# Patient Record
Sex: Female | Born: 1965 | Race: White | Hispanic: No | Smoking: Current every day smoker
Health system: Southern US, Community
[De-identification: ages and names within clinical notes are randomized; demographics above are authoritative.]

## PROBLEM LIST (undated history)

## (undated) ENCOUNTER — Emergency Department (HOSPITAL_COMMUNITY): Payer: Medicare Other

## (undated) DIAGNOSIS — F445 Conversion disorder with seizures or convulsions: Secondary | ICD-10-CM

## (undated) DIAGNOSIS — F1920 Other psychoactive substance dependence, uncomplicated: Secondary | ICD-10-CM

## (undated) DIAGNOSIS — N200 Calculus of kidney: Secondary | ICD-10-CM

## (undated) DIAGNOSIS — M549 Dorsalgia, unspecified: Secondary | ICD-10-CM

## (undated) DIAGNOSIS — F32A Depression, unspecified: Secondary | ICD-10-CM

## (undated) DIAGNOSIS — F419 Anxiety disorder, unspecified: Secondary | ICD-10-CM

## (undated) DIAGNOSIS — F329 Major depressive disorder, single episode, unspecified: Secondary | ICD-10-CM

## (undated) DIAGNOSIS — G8929 Other chronic pain: Secondary | ICD-10-CM

## (undated) DIAGNOSIS — Z765 Malingerer [conscious simulation]: Secondary | ICD-10-CM

## (undated) DIAGNOSIS — J449 Chronic obstructive pulmonary disease, unspecified: Secondary | ICD-10-CM

## (undated) DIAGNOSIS — J45909 Unspecified asthma, uncomplicated: Secondary | ICD-10-CM

## (undated) HISTORY — DX: Major depressive disorder, single episode, unspecified: F32.9

## (undated) HISTORY — DX: Other psychoactive substance dependence, uncomplicated: F19.20

## (undated) HISTORY — PX: BACK SURGERY: SHX140

## (undated) HISTORY — DX: Chronic obstructive pulmonary disease, unspecified: J44.9

## (undated) HISTORY — DX: Conversion disorder with seizures or convulsions: F44.5

## (undated) HISTORY — DX: Malingerer (conscious simulation): Z76.5

## (undated) HISTORY — DX: Anxiety disorder, unspecified: F41.9

## (undated) HISTORY — DX: Depression, unspecified: F32.A

## (undated) HISTORY — PX: ABDOMINAL HYSTERECTOMY: SHX81

## (undated) HISTORY — PX: INSERTION OF MESH: SHX5868

---

## 1997-08-21 ENCOUNTER — Emergency Department (HOSPITAL_COMMUNITY): Admission: EM | Admit: 1997-08-21 | Discharge: 1997-08-21 | Payer: Self-pay | Admitting: Emergency Medicine

## 1997-09-30 ENCOUNTER — Emergency Department (HOSPITAL_COMMUNITY): Admission: EM | Admit: 1997-09-30 | Discharge: 1997-09-30 | Payer: Self-pay | Admitting: Emergency Medicine

## 1997-10-03 ENCOUNTER — Emergency Department (HOSPITAL_COMMUNITY): Admission: EM | Admit: 1997-10-03 | Discharge: 1997-10-03 | Payer: Self-pay | Admitting: Emergency Medicine

## 1997-10-05 ENCOUNTER — Emergency Department (HOSPITAL_COMMUNITY): Admission: EM | Admit: 1997-10-05 | Discharge: 1997-10-05 | Payer: Self-pay | Admitting: Emergency Medicine

## 2003-04-09 ENCOUNTER — Emergency Department (HOSPITAL_COMMUNITY): Admission: AD | Admit: 2003-04-09 | Discharge: 2003-04-09 | Payer: Self-pay | Admitting: Family Medicine

## 2003-09-28 ENCOUNTER — Emergency Department (HOSPITAL_COMMUNITY): Admission: EM | Admit: 2003-09-28 | Discharge: 2003-09-28 | Payer: Self-pay | Admitting: Emergency Medicine

## 2003-09-28 ENCOUNTER — Emergency Department (HOSPITAL_COMMUNITY): Admission: EM | Admit: 2003-09-28 | Discharge: 2003-09-28 | Payer: Self-pay | Admitting: Family Medicine

## 2003-09-29 ENCOUNTER — Emergency Department (HOSPITAL_COMMUNITY): Admission: EM | Admit: 2003-09-29 | Discharge: 2003-09-29 | Payer: Self-pay | Admitting: Emergency Medicine

## 2008-12-09 ENCOUNTER — Ambulatory Visit: Payer: Self-pay | Admitting: Family Medicine

## 2008-12-09 DIAGNOSIS — F411 Generalized anxiety disorder: Secondary | ICD-10-CM | POA: Insufficient documentation

## 2008-12-09 DIAGNOSIS — J45909 Unspecified asthma, uncomplicated: Secondary | ICD-10-CM | POA: Insufficient documentation

## 2008-12-16 ENCOUNTER — Telehealth (INDEPENDENT_AMBULATORY_CARE_PROVIDER_SITE_OTHER): Payer: Self-pay | Admitting: *Deleted

## 2008-12-23 ENCOUNTER — Ambulatory Visit: Payer: Self-pay | Admitting: Family Medicine

## 2008-12-24 ENCOUNTER — Encounter: Payer: Self-pay | Admitting: Family Medicine

## 2008-12-25 LAB — CONVERTED CEMR LAB
ALT: 22 units/L (ref 0–35)
AST: 16 units/L (ref 0–37)
Albumin: 3.9 g/dL (ref 3.5–5.2)
Alkaline Phosphatase: 75 units/L (ref 39–117)
BUN: 12 mg/dL (ref 6–23)
Basophils Absolute: 0 10*3/uL (ref 0.0–0.1)
Basophils Relative: 0 % (ref 0–1)
CO2: 25 meq/L (ref 19–32)
Calcium: 8.9 mg/dL (ref 8.4–10.5)
Chloride: 101 meq/L (ref 96–112)
Creatinine, Ser: 0.72 mg/dL (ref 0.40–1.20)
Eosinophils Absolute: 0.4 10*3/uL (ref 0.0–0.7)
Eosinophils Relative: 3 % (ref 0–5)
Glucose, Bld: 97 mg/dL (ref 70–99)
HCT: 43.7 % (ref 36.0–46.0)
Hemoglobin: 14 g/dL (ref 12.0–15.0)
Lymphocytes Relative: 27 % (ref 12–46)
Lymphs Abs: 2.8 10*3/uL (ref 0.7–4.0)
MCHC: 32 g/dL (ref 30.0–36.0)
MCV: 94.4 fL (ref 78.0–100.0)
Monocytes Absolute: 0.8 10*3/uL (ref 0.1–1.0)
Monocytes Relative: 8 % (ref 3–12)
Neutro Abs: 6.5 10*3/uL (ref 1.7–7.7)
Neutrophils Relative %: 62 % (ref 43–77)
Platelets: 225 10*3/uL (ref 150–400)
Potassium: 4.1 meq/L (ref 3.5–5.3)
RBC: 4.63 M/uL (ref 3.87–5.11)
RDW: 13.1 % (ref 11.5–15.5)
Sodium: 136 meq/L (ref 135–145)
Total Bilirubin: 0.9 mg/dL (ref 0.3–1.2)
Total Protein: 6.6 g/dL (ref 6.0–8.3)
WBC: 10.5 10*3/uL (ref 4.0–10.5)

## 2008-12-28 ENCOUNTER — Telehealth: Payer: Self-pay | Admitting: Family Medicine

## 2009-01-05 ENCOUNTER — Ambulatory Visit: Payer: Self-pay | Admitting: Family Medicine

## 2009-01-05 ENCOUNTER — Encounter: Admission: RE | Admit: 2009-01-05 | Discharge: 2009-01-05 | Payer: Self-pay | Admitting: Family Medicine

## 2009-01-08 ENCOUNTER — Telehealth: Payer: Self-pay | Admitting: Family Medicine

## 2009-01-09 ENCOUNTER — Telehealth: Payer: Self-pay | Admitting: Family Medicine

## 2009-01-12 ENCOUNTER — Telehealth: Payer: Self-pay | Admitting: Family Medicine

## 2009-01-20 ENCOUNTER — Ambulatory Visit: Payer: Self-pay | Admitting: Family Medicine

## 2009-01-20 DIAGNOSIS — R252 Cramp and spasm: Secondary | ICD-10-CM | POA: Insufficient documentation

## 2009-01-20 LAB — CONVERTED CEMR LAB
Bacteria, UA: NONE SEEN
Blood in Urine, dipstick: NEGATIVE
Glucose, Urine, Semiquant: NEGATIVE
Ketones, urine, test strip: NEGATIVE
Nitrite: NEGATIVE
Protein, U semiquant: 30
RBC / HPF: NONE SEEN (ref <3)
Specific Gravity, Urine: 1.025
Urobilinogen, UA: 0.2
WBC Urine, dipstick: NEGATIVE
WBC, UA: NONE SEEN cells/hpf (ref <3)
pH: 5.5

## 2009-01-21 ENCOUNTER — Encounter: Payer: Self-pay | Admitting: Family Medicine

## 2009-01-30 ENCOUNTER — Telehealth: Payer: Self-pay | Admitting: Family Medicine

## 2009-02-01 ENCOUNTER — Telehealth: Payer: Self-pay | Admitting: Family Medicine

## 2009-02-15 ENCOUNTER — Ambulatory Visit: Payer: Self-pay | Admitting: Family Medicine

## 2009-02-15 DIAGNOSIS — R519 Headache, unspecified: Secondary | ICD-10-CM | POA: Insufficient documentation

## 2009-02-15 DIAGNOSIS — R51 Headache: Secondary | ICD-10-CM | POA: Insufficient documentation

## 2009-02-16 LAB — CONVERTED CEMR LAB
ALT: 23 units/L (ref 0–35)
AST: 20 units/L (ref 0–37)
Albumin: 4.6 g/dL (ref 3.5–5.2)
Alkaline Phosphatase: 64 units/L (ref 39–117)
BUN: 16 mg/dL (ref 6–23)
Basophils Absolute: 0.1 10*3/uL (ref 0.0–0.1)
Basophils Relative: 1 % (ref 0–1)
CO2: 25 meq/L (ref 19–32)
Calcium: 9.4 mg/dL (ref 8.4–10.5)
Chloride: 99 meq/L (ref 96–112)
Creatinine, Ser: 0.85 mg/dL (ref 0.40–1.20)
Eosinophils Absolute: 0.1 10*3/uL (ref 0.0–0.7)
Eosinophils Relative: 1 % (ref 0–5)
Glucose, Bld: 100 mg/dL — ABNORMAL HIGH (ref 70–99)
HCT: 47.7 % — ABNORMAL HIGH (ref 36.0–46.0)
Hemoglobin: 16 g/dL — ABNORMAL HIGH (ref 12.0–15.0)
Lymphocytes Relative: 37 % (ref 12–46)
Lymphs Abs: 3.3 10*3/uL (ref 0.7–4.0)
MCHC: 33.5 g/dL (ref 30.0–36.0)
MCV: 91.6 fL (ref 78.0–100.0)
Monocytes Absolute: 0.7 10*3/uL (ref 0.1–1.0)
Monocytes Relative: 7 % (ref 3–12)
Neutro Abs: 4.9 10*3/uL (ref 1.7–7.7)
Neutrophils Relative %: 54 % (ref 43–77)
Platelets: 250 10*3/uL (ref 150–400)
Potassium: 4.2 meq/L (ref 3.5–5.3)
RBC: 5.21 M/uL — ABNORMAL HIGH (ref 3.87–5.11)
RDW: 13 % (ref 11.5–15.5)
Sodium: 136 meq/L (ref 135–145)
TSH: 2.794 microintl units/mL (ref 0.350–4.500)
Total Bilirubin: 1.9 mg/dL — ABNORMAL HIGH (ref 0.3–1.2)
Total Protein: 7.3 g/dL (ref 6.0–8.3)
WBC: 9 10*3/uL (ref 4.0–10.5)

## 2009-02-17 ENCOUNTER — Telehealth: Payer: Self-pay | Admitting: Family Medicine

## 2009-03-13 ENCOUNTER — Ambulatory Visit: Payer: Self-pay | Admitting: Family Medicine

## 2009-03-13 DIAGNOSIS — IMO0001 Reserved for inherently not codable concepts without codable children: Secondary | ICD-10-CM | POA: Insufficient documentation

## 2009-03-13 DIAGNOSIS — R42 Dizziness and giddiness: Secondary | ICD-10-CM | POA: Insufficient documentation

## 2009-03-13 DIAGNOSIS — R110 Nausea: Secondary | ICD-10-CM | POA: Insufficient documentation

## 2009-03-14 ENCOUNTER — Encounter: Payer: Self-pay | Admitting: Family Medicine

## 2009-03-14 LAB — CONVERTED CEMR LAB
CRP: 0.2 mg/dL (ref <0.6)
Iron: 88 ug/dL (ref 42–145)
Saturation Ratios: 24 % (ref 20–55)
Sed Rate: 8 mm/hr (ref 0–22)
TIBC: 362 ug/dL (ref 250–470)
Total CK: 198 units/L — ABNORMAL HIGH (ref 7–177)
UIBC: 274 ug/dL

## 2009-03-21 ENCOUNTER — Telehealth: Payer: Self-pay | Admitting: Family Medicine

## 2009-04-04 ENCOUNTER — Telehealth: Payer: Self-pay | Admitting: Family Medicine

## 2009-04-06 ENCOUNTER — Telehealth: Payer: Self-pay | Admitting: Family Medicine

## 2009-04-06 ENCOUNTER — Telehealth (INDEPENDENT_AMBULATORY_CARE_PROVIDER_SITE_OTHER): Payer: Self-pay | Admitting: *Deleted

## 2009-04-11 ENCOUNTER — Ambulatory Visit: Payer: Self-pay | Admitting: Family Medicine

## 2009-05-05 ENCOUNTER — Telehealth: Payer: Self-pay | Admitting: Family Medicine

## 2009-05-09 ENCOUNTER — Telehealth: Payer: Self-pay | Admitting: Family Medicine

## 2009-05-11 ENCOUNTER — Ambulatory Visit: Payer: Self-pay | Admitting: Family Medicine

## 2009-05-11 LAB — CONVERTED CEMR LAB: Glucose, Bld: 84 mg/dL

## 2009-05-12 ENCOUNTER — Telehealth: Payer: Self-pay | Admitting: Family Medicine

## 2009-05-12 LAB — CONVERTED CEMR LAB
Magnesium: 1.9 mg/dL (ref 1.5–2.5)
Total CK: 64 units/L (ref 7–177)

## 2009-05-15 ENCOUNTER — Telehealth: Payer: Self-pay | Admitting: Family Medicine

## 2009-05-19 ENCOUNTER — Encounter (INDEPENDENT_AMBULATORY_CARE_PROVIDER_SITE_OTHER): Payer: Self-pay | Admitting: *Deleted

## 2009-05-22 ENCOUNTER — Telehealth (INDEPENDENT_AMBULATORY_CARE_PROVIDER_SITE_OTHER): Payer: Self-pay | Admitting: *Deleted

## 2009-05-22 ENCOUNTER — Telehealth: Payer: Self-pay | Admitting: Family Medicine

## 2009-05-23 ENCOUNTER — Telehealth: Payer: Self-pay | Admitting: Family Medicine

## 2009-05-23 ENCOUNTER — Telehealth (INDEPENDENT_AMBULATORY_CARE_PROVIDER_SITE_OTHER): Payer: Self-pay | Admitting: *Deleted

## 2009-05-25 ENCOUNTER — Telehealth: Payer: Self-pay | Admitting: Family Medicine

## 2009-05-29 ENCOUNTER — Telehealth (INDEPENDENT_AMBULATORY_CARE_PROVIDER_SITE_OTHER): Payer: Self-pay | Admitting: *Deleted

## 2009-06-06 ENCOUNTER — Ambulatory Visit: Payer: Self-pay | Admitting: Family Medicine

## 2009-06-13 ENCOUNTER — Encounter: Payer: Self-pay | Admitting: Family Medicine

## 2009-06-13 ENCOUNTER — Telehealth (INDEPENDENT_AMBULATORY_CARE_PROVIDER_SITE_OTHER): Payer: Self-pay | Admitting: *Deleted

## 2009-06-19 ENCOUNTER — Encounter (INDEPENDENT_AMBULATORY_CARE_PROVIDER_SITE_OTHER): Payer: Self-pay | Admitting: *Deleted

## 2009-06-19 ENCOUNTER — Encounter: Payer: Self-pay | Admitting: Family Medicine

## 2009-06-20 ENCOUNTER — Telehealth (INDEPENDENT_AMBULATORY_CARE_PROVIDER_SITE_OTHER): Payer: Self-pay | Admitting: *Deleted

## 2009-07-03 ENCOUNTER — Ambulatory Visit: Payer: Self-pay | Admitting: Family Medicine

## 2009-07-18 ENCOUNTER — Ambulatory Visit: Payer: Self-pay | Admitting: Family Medicine

## 2009-07-19 ENCOUNTER — Telehealth (INDEPENDENT_AMBULATORY_CARE_PROVIDER_SITE_OTHER): Payer: Self-pay | Admitting: *Deleted

## 2009-07-19 DIAGNOSIS — M79609 Pain in unspecified limb: Secondary | ICD-10-CM | POA: Insufficient documentation

## 2009-07-29 ENCOUNTER — Encounter: Payer: Self-pay | Admitting: *Deleted

## 2009-08-02 ENCOUNTER — Encounter: Payer: Self-pay | Admitting: Family Medicine

## 2009-08-18 ENCOUNTER — Encounter: Payer: Self-pay | Admitting: Family Medicine

## 2010-01-11 DIAGNOSIS — G2581 Restless legs syndrome: Secondary | ICD-10-CM | POA: Insufficient documentation

## 2010-04-08 HISTORY — PX: CHOLECYSTECTOMY: SHX55

## 2010-04-29 ENCOUNTER — Encounter: Payer: Self-pay | Admitting: Family Medicine

## 2010-05-08 NOTE — Progress Notes (Signed)
Summary: ? on meds  Phone Note Call from Patient Call back at Home Phone [phone]   Caller: Patient Call For: Nani Gasser MD Summary of Call: Pt called and wanted to know if she needed to use the albuterol with the duoneb or hold the albuterol when uses the duoneb- pt is confused. Also said something about ? referral to pulmonology Initial call taken by: Kathlene November,  January 12, 2009 2:38 PM  Follow-up for Phone Call        We can refer to pulm if she wouldlike.  albuterolis in the duoneb so doesn't need to do both.  Follow-up by: Nani Gasser MD,  January 12, 2009 2:53 PM  Additional Follow-up for Phone Call Additional follow up Details #1::        pt notified. KJ LPN Additional Follow-up by: Kathlene November,  January 12, 2009 3:26 PM

## 2010-05-08 NOTE — Progress Notes (Signed)
Summary: needs appt  Phone Note Call from Patient Call back at Home Phone [phone]   Caller: Patient Call For: Nani Gasser MD Summary of Call: pt wants to know if she needs appt cause she is depressed and her body is cramping all over.  She also has swelling in her ankles.  I called pt to schedule appt but it just rings and there is not an answering machine Initial call taken by: Alfred Levins, CMA,  April 06, 2009 3:21 PM  Follow-up for Phone Call        CALLED PT TO [facility] AN APPT Follow-up by: Vanessa Swaziland,  April 10, 2009 8:23 AM

## 2010-05-08 NOTE — Assessment & Plan Note (Signed)
Summary: leg pain & f/u on meds   Vital Signs:  Patient profile:   45 year old female Height:      65 inches Weight:      193 pounds Pulse rate:   80 / minute BP sitting:   128 / 70  (left arm) Cuff size:   large  Vitals Entered By: Kathlene November (July 18, 2009 10:07 AM) CC: pain in legs for months- no better also pain in left side of head that comes and goes for a long time now   Primary Care Provider:  Nani Gasser MD  CC:  pain in legs for months- no better also pain in left side of head that comes and goes for a long time now.  History of Present Illness: pain in legs for months- no better also pain in left side of head that comes and goes for a long time now.   Tried the requip for about for over a month and didn't really help.   Never tried taking 2 tabs as I had instructed.  Has also felt more off balance lately. Still has not rescheduled her rheumatology appt.  Wonders if she would benefit from seeing neurology for her leg pain.    Current Medications (verified): 1)  Xanax 1 Mg Tabs (Alprazolam) .Marland Kitchen.. 1 Tab By Mouth Two Times A Day To Three Times A Day As Needed 2)  Proair Hfa 108 (90 Base) Mcg/act Aers (Albuterol Sulfate) .... As Directed 3)  Albuterol Sulfate (5 Mg/ml) 0.5% Nebu (Albuterol Sulfate) .... As Directed 4)  Proventil Hfa 108 (90 Base) Mcg/act Aers (Albuterol Sulfate) .... As Directed 5)  Ambien 10 Mg Tabs (Zolpidem Tartrate) .Marland Kitchen.. 1 By Mouth Hs As Needed Insomnia 6)  Advair Diskus 500-50 Mcg/dose Misc (Fluticasone-Salmeterol) .... Take One (1) Inhalation Twice A Day 7)  Abilify 10 Mg Tabs (Aripiprazole) .... Take 1 Tablet By Mouth Once A Day 8)  Requip 0.25 Mg Tabs (Ropinirole Hcl) .Marland Kitchen.. 1-2 Tabs At in The Evening.  Allergies (verified): No Known Drug Allergies  Comments:  Nurse/Medical Assistant: The patient's medications and allergies were reviewed with the patient and were updated in the Medication and Allergy Lists. Kathlene November (July 18, 2009 10:08  AM)  Social History: Reviewed history from 04/11/2009 and no changes required. Widowed (husband committed suicide).  Has 2 kids.  Currently living with her father.  Current Smoker Alcohol use-no Drug use-no Regular exercise-no  Physical Exam  General:  Well-developed,well-nourished,in no acute distress; alert,appropriate and cooperative throughout examination   Impression & Recommendations:  Problem # 1:  LEG CRAMPS (ICD-729.82) ONce again had a discussed about her pain. I let her know that she could certainly see neurology for her leg pain and they could even do a nerve conduction study. Also let her know that the reason I chose to do a rheum referral is that her pain is often systemic even though her worst pain is in her legs. Gave her the phone numbers and names of 2 really good Neurologists in town. It is up to her to call and schedule her appt. The requip didn't help so I told her to stop it. I think she has already stopped it anyway.   Problem # 2:  MUSCLE PAIN (ICD-729.1)  Complete Medication List: 1)  Xanax 1 Mg Tabs (Alprazolam) .Marland Kitchen.. 1 tab by mouth two times a day to three times a day as needed 2)  Proair Hfa 108 (90 Base) Mcg/act Aers (Albuterol sulfate) .... As directed 3)  Albuterol Sulfate (5 Mg/ml) 0.5% Nebu (Albuterol sulfate) .... As directed 4)  Proventil Hfa 108 (90 Base) Mcg/act Aers (Albuterol sulfate) .... As directed 5)  Ambien 10 Mg Tabs (Zolpidem tartrate) .Marland Kitchen.. 1 by mouth hs as needed insomnia 6)  Advair Diskus 500-50 Mcg/dose Misc (Fluticasone-salmeterol) .... Take one (1) inhalation twice a day 7)  Abilify 10 Mg Tabs (Aripiprazole) .... Take 1 tablet by mouth once a day  Patient Instructions: 1)  Dr. Isabell Jarvis (neruology)  - [phone], ask for [facility] office 2)  *Dr. Wilfrid Lund (Neurology)- [phone], ask for the [facility] office

## 2010-05-08 NOTE — Progress Notes (Signed)
Summary: Seroquel side effects  Phone Note Call from Patient Call back at Home Phone [phone]   Caller: Patient Call For: Nani Gasser MD Summary of Call: Pt calls and states that the Seroquel makes her feel like a Zombie and wanted to know if you would rx her the Ambien Initial call taken by: Kathlene November,  January 30, 2009 1:23 PM  Follow-up for Phone Call        Try moving the seroquel to about 4 hours before bedtime. Try this first for about 2 nights and if not working then can rety the Ambien.  Follow-up by: Nani Gasser MD,  January 30, 2009 1:37 PM  Additional Follow-up for Phone Call Additional follow up Details #1::        Pt notified of MD instructions. Pt verbalized understanding.  Additional Follow-up by: Kathlene November,  January 30, 2009 1:40 PM

## 2010-05-08 NOTE — Assessment & Plan Note (Signed)
Summary: Leg cramps, asthma, etc   Vital Signs:  Patient profile:   45 year old female Height:      65 inches Weight:      189 pounds BMI:     31.56 O2 Sat:      96 % on Room air Temp:     98.7 degrees F oral Pulse rate:   83 / minute BP sitting:   128 / 86  (left arm) Cuff size:   regular  Vitals Entered By: Payton Spark CMA (January 20, 2009 11:10 AM)  O2 Flow:  Room air CC: Leg pain.    Primary Care Provider:  Nani Gasser MD  CC:  Leg pain. Marland Kitchen  History of Present Illness: Pain down both legs especially at night.  Feels crampy and muscles are twitching.  Started after starting the cymbalta.  GErs some relief when moves her legs. Went up on the cymbalat 60 at last visit and did for a week but made her feel out there so went back down to 30mg .  Not sleeping well. Not drinking or eating much. Says her urine is dark brown.   Still cough adn wheezing.  HAving asthma flare. Started on steroids last week and completed on Monday.  Feels she needs to restart them. Has been using her NEB maching at home but still living with her father who is a chain smoker and this has really been irritating her breathing. She says she has completely quit smoking herself. Has appt scheduled with pulm.  Current Medications (verified): 1)  Xanax 1 Mg Tabs (Alprazolam) .Marland Kitchen.. 1 Tab By Mouth Two Times A Day To Three Times A Day As Needed 2)  Proair Hfa 108 (90 Base) Mcg/act Aers (Albuterol Sulfate) .... As Directed 3)  Albuterol Sulfate (5 Mg/ml) 0.5% Nebu (Albuterol Sulfate) .... As Directed 4)  Proventil Hfa 108 (90 Base) Mcg/act Aers (Albuterol Sulfate) .... As Directed 5)  Cymbalta 30 Mg Cpep (Duloxetine Hcl) .... Take 1 Tablet By Mouth Once A Day 6)  Ambien 10 Mg Tabs (Zolpidem Tartrate) .Marland Kitchen.. 1 By Mouth Hs As Needed Insomnia 7)  Symbicort 160-4.5 Mcg/act Aero (Budesonide-Formoterol Fumarate) .... 2 Puffs Inhaled Two Times A Day 8)  Singulair 10 Mg Tabs (Montelukast Sodium) .... Take 1 Tablet By  Mouth Once A Day 9)  Prednisone 10 Mg Tabs (Prednisone) .... 2 Tabs By Mouth Once A Day For A Week.  Allergies (verified): No Known Drug Allergies  Comments:  Nurse/Medical Assistant: Payton Spark CMA (January 20, 2009 11:11 AM)  Past History:  Past Medical History: Last updated: 12/23/2008 Anxiety Asthma Fibromyalgia Hx of coumadin therpay for DVT.   Social History: Last updated: 01/20/2009 Widowed.  Has 2 kids.  Currently living with her father.  Current Smoker Alcohol use-no Drug use-no Regular exercise-no  Social History: Widowed.  Has 2 kids.  Currently living with her father.  Current Smoker Alcohol use-no Drug use-no Regular exercise-no  Physical Exam  General:  Well-developed,well-nourished,in no acute distress; alert,appropriate and cooperative throughout examination Head:  Normocephalic and atraumatic without obvious abnormalities. No apparent alopecia or balding. Eyes:  No corneal or conjunctival inflammation noted. EOMI. Perrla.  Neck:  No deformities, masses, or tenderness noted. Lungs:  Dec BS bilaterally, Poor airflow, expiratory wheezing bilaterally.  She is not tachypneic.  Cough sounds bornchitic.   Heart:  Normal rate and regular rhythm. S1 and S2 normal without gallop, murmur, click, rub or other extra sounds. Pulses:  Radial 2+  Neurologic:  alert & oriented  X3 and gait normal.   Skin:  no rashes.   Cervical Nodes:  No lymphadenopathy noted Psych:  Cognition and judgment appear intact. Alert and cooperative with normal attention span and concentration. No apparent delusions, illusions, hallucinationsdepressed affect, flat affect, and poor eye contact.     Impression & Recommendations:  Problem # 1:  LEG CRAMPS (ICD-729.82) Assessment New Based on her desripton is sounds like RLS but need to rule out anemia and deficiencies and low potassium first. If all nromal then consider treatment with requip.   Orders: T-Basic Metabolic Panel  ([id number]) T-TSH [phone]) T-Vitamin B12 [id number]) T-Iron [phone]) T-Folate [phone])  Problem # 2:  ASTHMA (ICD-493.90) Assessment: Deteriorated Still very tight and wheezing.  Offered to do a NEB here but says she is going straight home and will do one at home.  Given IM steroid 80mg   and will do a taper over 24 days. Use her NEBS every 4 hours. If needing more often then needs to go to the ED. Has appt scheduled with pulm.  Also needs allergy testing. Will let her see pulm first.  Her updated medication list for this problem includes:    Proair Hfa 108 (90 Base) Mcg/act Aers (Albuterol sulfate) .Marland Kitchen... As directed    Albuterol Sulfate (5 Mg/ml) 0.5% Nebu (Albuterol sulfate) .Marland Kitchen... As directed    Proventil Hfa 108 (90 Base) Mcg/act Aers (Albuterol sulfate) .Marland Kitchen... As directed    Symbicort 160-4.5 Mcg/act Aero (Budesonide-formoterol fumarate) .Marland Kitchen... 2 puffs inhaled two times a day    Singulair 10 Mg Tabs (Montelukast sodium) .Marland Kitchen... Take 1 tablet by mouth once a day    Prednisone 10 Mg Tabs (Prednisone) .Marland KitchenMarland KitchenMarland KitchenMarland Kitchen 4 a day for 5 days, 3 a day for 5 days, 2 a day for 5 days, 1 a day for 5 days, then 1/2 a tab for 4 days, then stop.  Problem # 3:  ANXIETY (ICD-300.00) Assessment: Unchanged Discussed since didn't do well on the higer dose of cymbalta will add seroquel at bedtime to help with sleep and with mood. FU in 3-4 weeks to make sure improving.  Pt has taken before and did well.  Her updated medication list for this problem includes:    Xanax 1 Mg Tabs (Alprazolam) .Marland Kitchen... 1 tab by mouth two times a day to three times a day as needed    Cymbalta 30 Mg Cpep (Duloxetine hcl) .Marland Kitchen... Take 1 tablet by mouth once a day  Problem # 4:  VOIDING HESITANCY (ICD-788.69) UA + for protein. Likely dehydrated but will send for culture and micro.   Orders: UA Dipstick w/o Micro (automated)  (81003) T-Urine Culture (Spectrum Order) [id number]) T-Urine Microscopic [phone])  Complete Medication  List: 1)  Xanax 1 Mg Tabs (Alprazolam) .Marland Kitchen.. 1 tab by mouth two times a day to three times a day as needed 2)  Proair Hfa 108 (90 Base) Mcg/act Aers (Albuterol sulfate) .... As directed 3)  Albuterol Sulfate (5 Mg/ml) 0.5% Nebu (Albuterol sulfate) .... As directed 4)  Proventil Hfa 108 (90 Base) Mcg/act Aers (Albuterol sulfate) .... As directed 5)  Cymbalta 30 Mg Cpep (Duloxetine hcl) .... Take 1 tablet by mouth once a day 6)  Ambien 10 Mg Tabs (Zolpidem tartrate) .Marland Kitchen.. 1 by mouth hs as needed insomnia 7)  Symbicort 160-4.5 Mcg/act Aero (Budesonide-formoterol fumarate) .... 2 puffs inhaled two times a day 8)  Singulair 10 Mg Tabs (Montelukast sodium) .... Take 1 tablet by mouth once a day 9)  Prednisone 10 Mg Tabs (Prednisone) .Marland KitchenMarland KitchenMarland Kitchen  4 a day for 5 days, 3 a day for 5 days, 2 a day for 5 days, 1 a day for 5 days, then 1/2 a tab for 4 days, then stop. 10)  Seroquel 25 Mg Tabs (Quetiapine fumarate) .... Take 1 tablet by mouth once a day at bedtime Prescriptions: XANAX 1 MG TABS (ALPRAZOLAM) 1 tab by mouth two times a day to three times a day as needed  #60 x 0   Entered and Authorized by:   Nani Gasser MD   Signed by:   Nani Gasser MD on 01/20/2009   Method used:   Print then Give to Patient   RxID:   [id number] SEROQUEL 25 MG TABS (QUETIAPINE FUMARATE) Take 1 tablet by mouth once a day at bedtime  #30 x 0   Entered and Authorized by:   Nani Gasser MD   Signed by:   Nani Gasser MD on 01/20/2009   Method used:   [facility] to        [facility] [phone]* (retail)       [address] [place], [place]  [address]       Ph: [phone] or [phone]       Fax: [phone]   RxID:   [id number] PREDNISONE 10 MG TABS (PREDNISONE) 4 a day for 5 days, 3 a day for 5 days, 2 a day for 5 days, 1 a day for 5 days, then 1/2 a tab for 4 days, then stop.  #52 x 0   Entered and Authorized by:   Nani Gasser MD   Signed by:   Nani Gasser MD on  01/20/2009   Method used:   [facility]ically to        [facility] [phone]* (retail)       [address] [place], [place]  [address]       Ph: [phone] or [phone]       Fax: [phone]   RxID:   [id number]   Laboratory Results   Urine Tests    Routine Urinalysis   Color: orange Appearance: Clear Glucose: negative   (Normal Range: Negative) Bilirubin: small   (Normal Range: Negative) Ketone: negative   (Normal Range: Negative) Spec. Gravity: 1.025   (Normal Range: 1.003-1.035) Blood: negative   (Normal Range: Negative) pH: 5.5   (Normal Range: 5.0-8.0) Protein: 30   (Normal Range: Negative) Urobilinogen: 0.2   (Normal Range: 0-1) Nitrite: negative   (Normal Range: Negative) Leukocyte Esterace: negative   (Normal Range: Negative)       Appended Document: Leg cramps, asthma, etc     Allergies: No Known Drug Allergies   Complete Medication List: 1)  Xanax 1 Mg Tabs (Alprazolam) .Marland Kitchen.. 1 tab by mouth two times a day to three times a day as needed 2)  Proair Hfa 108 (90 Base) Mcg/act Aers (Albuterol sulfate) .... As directed 3)  Albuterol Sulfate (5 Mg/ml) 0.5% Nebu (Albuterol sulfate) .... As directed 4)  Proventil Hfa 108 (90 Base) Mcg/act Aers (Albuterol sulfate) .... As directed 5)  Cymbalta 30 Mg Cpep (Duloxetine hcl) .... Take 1 tablet by mouth once a day 6)  Ambien 10 Mg Tabs (Zolpidem tartrate) .Marland Kitchen.. 1 by mouth hs as needed insomnia 7)  Symbicort 160-4.5 Mcg/act Aero (Budesonide-formoterol fumarate) .... 2 puffs inhaled two times a day 8)  Singulair 10 Mg Tabs (Montelukast sodium) .... Take 1 tablet by mouth once  a day 9)  Prednisone 10 Mg Tabs (Prednisone) .... 4 a day for 5 days, 3 a day for 5 days, 2 a day for 5 days, 1 a day for 5 days, then 1/2 a tab for 4 days, then stop. 10)  Seroquel 25 Mg Tabs (Quetiapine fumarate) .... Take 1 tablet by mouth once a day at bedtime  Other Orders: Depo- Medrol 80mg  (J1040) Admin of Therapeutic Inj   intramuscular or subcutaneous ([address])    Medication Administration  Injection # 1:    Medication: Depo- Medrol 80mg     Diagnosis: ASTHMA (ICD-493.90)    Route: IM    Site: RUOQ gluteus    Exp Date: 10/2010    Lot #: [id number]    Patient tolerated injection without complications    Given by: Payton Spark CMA (January 20, 2009 1:28 PM)  Orders Added: 1)  Depo- Medrol 80mg  [J1040] 2)  Admin of Therapeutic Inj  intramuscular or subcutaneous [60454]

## 2010-05-08 NOTE — Progress Notes (Signed)
  Phone Note Call from Patient Call back at [phone]   Caller: Patient Call For: Nani Gasser MD Reason for Call: Referral Complaint: Earache/Ear Infection Summary of Call: patient has not been able to make rheumatology appointment due to transportation issues in getting to [facility].  Advised patient that we are still working on making a  rheumatology referral in the [place] area.   Psychiatry appointment tomorrow with [facility]  Website  [phone] [address]  [place]  Spoke with patient and advised her that she will need to contact this office to confirm appointment, also advised staff to notify us if patient does not keep appointment.  Patient has been notified that if she is unable to keep referral appointments that we will need to discharge her due to non-compliancy since we have been attempting referral appointments since 12/2008. kb Initial call taken by: Charolett Bumpers,  May 22, 2009 3:08 PM  Follow-up for Phone Call        spoke with patient who advised that Rheumatology appt is scheduled for 06-07-09 and she is contacting Health visitor for appointment.  Repeated to patient the importance of keeping these appointments and that she is to let us know if appointment is not kept or changed. Charolett Bumpers  May 29, 2009 9:00 AM

## 2010-05-08 NOTE — Assessment & Plan Note (Signed)
Summary: F/U myalgia   Vital Signs:  Patient profile:   45 year old female Height:      65 inches Weight:      197 pounds BMI:     32.90 O2 Sat:      98 % on Room air Temp:     97.7 degrees F oral Pulse rate:   63 / minute BP sitting:   120 / 70  (right arm)  Vitals Entered By: Selena Batten Johnson/April (May 11, 2009 2:15 PM)  O2 Flow:  Room air CC: F/U   Primary Care Provider:  Nani Gasser MD  CC:  F/U.  History of Present Illness: Hasn't eaten or had food today.  Just doesnt feel well today. Feels achey all over. Not sleeping well.  Using the Ambien as needed, not every night. Has tolerated the abilify well without any SE but she is not sure it is really helping her at this time. Still smoking. No inc SOB.  Current Medications (verified): 1)  Xanax 1 Mg Tabs (Alprazolam) .Marland Kitchen.. 1 Tab By Mouth Two Times A Day To Three Times A Day As Needed 2)  Proair Hfa 108 (90 Base) Mcg/act Aers (Albuterol Sulfate) .... As Directed 3)  Albuterol Sulfate (5 Mg/ml) 0.5% Nebu (Albuterol Sulfate) .... As Directed 4)  Proventil Hfa 108 (90 Base) Mcg/act Aers (Albuterol Sulfate) .... As Directed 5)  Paxil 40 Mg Tabs (Paroxetine Hcl) .... Take 1 Tablet By Mouth Once A Day 6)  Ambien 10 Mg Tabs (Zolpidem Tartrate) .Marland Kitchen.. 1 By Mouth Hs As Needed Insomnia 7)  Advair Diskus 500-50 Mcg/dose Misc (Fluticasone-Salmeterol) .... Take One (1) Inhalation Twice A Day 8)  Abilify 2 Mg Tabs (Aripiprazole) .... Take 1 Tablet By Mouth Once A Day  Allergies (verified): No Known Drug Allergies  Physical Exam  General:  Well-developed,well-nourished,in no acute distress; alert,appropriate and cooperative throughout examination Lungs:  Slight wheeze adn cough.  Skin:  no rashes.  no rashes.     Impression & Recommendations:  Problem # 1:  MUSCLE PAIN (ICD-729.1)  Has appt with the specialist in 3 weeks. STRONGLY encouraged her to keep this appt.  IN meantime increase the abilify to 2 tabs (total 4mg ) and  when runs out can start the 10mg  tabs. She has tolerated  the 2mg  dose well. Also Consisder changing from paxil to savella. Pt prefer to stay with paxil right now. Will need t monitor weight gain.  Needs to wuit smoking. She knows but is not ready to do that.  Has not gone for labs yet so reminded her to do that. Will keep an eye on the weight gain.   Orders: Fingerstick (16109) Glucose,(CBG) using home device (60454)  Complete Medication List: 1)  Xanax 1 Mg Tabs (Alprazolam) .Marland Kitchen.. 1 tab by mouth two times a day to three times a day as needed 2)  Proair Hfa 108 (90 Base) Mcg/act Aers (Albuterol sulfate) .... As directed 3)  Albuterol Sulfate (5 Mg/ml) 0.5% Nebu (Albuterol sulfate) .... As directed 4)  Proventil Hfa 108 (90 Base) Mcg/act Aers (Albuterol sulfate) .... As directed 5)  Savella Titration Pack 12.5 & 25 & 50 Mg Misc (Milnacipran hcl) .... Take as directed. 6)  Ambien 10 Mg Tabs (Zolpidem tartrate) .Marland Kitchen.. 1 by mouth hs as needed insomnia 7)  Advair Diskus 500-50 Mcg/dose Misc (Fluticasone-salmeterol) .... Take one (1) inhalation twice a day 8)  Abilify 10 Mg Tabs (Aripiprazole) .... Take 1 tablet by mouth once a day  Patient Instructions:  1)  INcrease abilify to 2 tabs and when run out start the 10mg  tabs. 2)  Continue paxil at current dose.  Prescriptions: SAVELLA TITRATION PACK 12.5 & 25 & 50 MG MISC (MILNACIPRAN HCL) Take as directed.  #1 pack x 0   Entered and Authorized by:   Nani Gasser MD   Signed by:   Nani Gasser MD on 05/11/2009   Method used:   [facility] to        [facility] [phone]* (retail)       [address] [place], [place]  [address]       Ph: [phone] or [phone]       Fax: [phone]   RxID:   [id number] ABILIFY 10 MG TABS (ARIPIPRAZOLE) Take 1 tablet by mouth once a day  #30 x 0   Entered and Authorized by:   Nani Gasser MD   Signed by:   Nani Gasser MD on 05/11/2009   Method used:   Electronically to         [facility] [phone]* (retail)       [address] [place], [place]  [address]       Ph: [phone] or [phone]       Fax: [phone]   RxID:   [id number]   Laboratory Results   Blood Tests     Glucose (random): 84 mg/dL   (Normal Range: 42-706)

## 2010-05-08 NOTE — Letter (Signed)
Summary: Discharge Letter  [facility]  [address] [place], [place] [address]   Phone: [phone]  Fax: [phone]       06/19/2009 MRN: [id number]  [facility] [address] [place], [place]  [address]  Dear [name],   I find it necessary to inform you that I will not be able to provide medical care to you, because you have non-compliant with our treatment recommendations.  You have failed to keep the appointments we made for you with Specialists on multiple occassions.  We advised you that we would not be able to continue treating you if you were unable to keep those appointments.  Since your condition requires medical attention, I suggest that you place your self under the care of another physician without delay. If you desire, I will be available for emergency care for 30 days after you receive this letter.  This should give you ample time to select a physician of your choice from the many competent providers in this area. You may want to call the local medical society or [facility]'s physician referral service [phone]) for their assistance in locating a new physician. With your written authorization, I will make a copy of your medical record available to your new physician.   Sincerely,    Nani Gasser, MD

## 2010-05-08 NOTE — Progress Notes (Signed)
Summary: Not feeling any better  Phone Note Call from Patient Call back at Home Phone [phone]   Caller: Patient Call For: Nani Gasser MD Summary of Call: Pt calls stating that she is not feeling any better and is very frustrated and doesn't feel like she can function this way anymore. Nausea and blurred vision. States doesn't know why she's calling you because she doesn't know what else there is to do but just very angry Initial call taken by: Kathlene November,  March 21, 2009 9:59 AM  Follow-up for Phone Call        Pt would like a CT of brain and back Feels like something going on to cause all this and her muscle weakness.   Will get Ct of head but has rheum appt in Jan so not going to do part of her spine.  Follow-up by: Kathlene November,  March 21, 2009 10:12 AM

## 2010-05-08 NOTE — Progress Notes (Signed)
Summary: Leg cramping  Phone Note Call from Patient   Caller: Patient Summary of Call: Pt LMOM stating she was having cramping and twitching in her legs and HA's are more frequent. She also said her apt w/ the specialist is not until Feb. Initial call taken by: Payton Spark CMA,  May 05, 2009 2:04 PM  Follow-up for Phone Call        Pt orig appt was in Jan and she didn't go so is up to her to call them and see if they have any cancelations for a sooner appt. She also didn't go to psych referral as well. We can recheck her muscle enzymes and maybe try requip in the evening for possible RLS.  Follow-up by: Nani Gasser MD,  May 05, 2009 2:12 PM  Additional Follow-up for Phone Call Additional follow up Details #1::        Tried to call Pt back but didnt get an answer and no machine to leave message. Additional Follow-up by: Payton Spark CMA,  May 05, 2009 2:23 PM    Additional Follow-up for Phone Call Additional follow up Details #2::    Pt notified of results.Would like to have muscle enzymes checked. Follow-up by: Kathlene November,  May 08, 2009 12:46 PM

## 2010-05-08 NOTE — Assessment & Plan Note (Signed)
Summary: HEADACHE X 5 DAYS, Leg cramps, etc   Vital Signs:  Patient profile:   45 year old female Height:      65 inches Weight:      187 pounds O2 Sat:      97 % on Room air Temp:     99.4 degrees F oral Pulse rate:   84 / minute BP sitting:   126 / 72  (left arm) Cuff size:   large  Vitals Entered By: Kathlene November (February 15, 2009 10:57 AM)  O2 Flow:  Room air  Serial Vital Signs/Assessments:                                PEF    PreRx  PostRx Time      O2 Sat  O2 Type     L/min  L/min  L/min   By 11:11 AM                      320    260    220     Kim Johnson  Comments: 11:11 AM pt in yellow zone By: Kathlene November   CC: headache and dizziness for 4 days now   Primary Care Provider:  Nani Gasser MD  CC:  headache and dizziness for 4 days now.  History of Present Illness: headache and dizziness for 4 days now.  Not sure what may be causing it.  Feels lightheaded. Feels nauseated with it. No rooms spinning. HA started a couple of days before.  HA is all over. Constant, rates it 7/10.  Taking Tylenol and Advil - no relief.  Having blurry vision but says it is chronic but says it feel s alittle worse.  Muscles in legs "keep moving".  Getting some leg cramps at night when tries to stretch. Has had HA this bad in the past.  Says has been very stressed lately. The seroquel didn't work so stopped it due to [employer]. No urinary sxs.   Current Medications (verified): 1)  Xanax 1 Mg Tabs (Alprazolam) .Marland Kitchen.. 1 Tab By Mouth Two Times A Day To Three Times A Day As Needed 2)  Proair Hfa 108 (90 Base) Mcg/act Aers (Albuterol Sulfate) .... As Directed 3)  Albuterol Sulfate (5 Mg/ml) 0.5% Nebu (Albuterol Sulfate) .... As Directed 4)  Proventil Hfa 108 (90 Base) Mcg/act Aers (Albuterol Sulfate) .... As Directed 5)  Cymbalta 30 Mg Cpep (Duloxetine Hcl) .... Take 1 Tablet By Mouth Once A Day 6)  Ambien 10 Mg Tabs (Zolpidem Tartrate) .Marland Kitchen.. 1 By Mouth Hs As Needed Insomnia 7)  Symbicort  160-4.5 Mcg/act Aero (Budesonide-Formoterol Fumarate) .... 2 Puffs Inhaled Two Times A Day 8)  Singulair 10 Mg Tabs (Montelukast Sodium) .... Take 1 Tablet By Mouth Once A Day 9)  Seroquel 25 Mg Tabs (Quetiapine Fumarate) .... Take 1 Tablet By Mouth Once A Day At Bedtime  Allergies (verified): No Known Drug Allergies  Comments:  Nurse/Medical Assistant: The patient's medications and allergies were reviewed with the patient and were updated in the Medication and Allergy Lists. Kathlene November (February 15, 2009 10:58 AM)  Past History:  Past Medical History: Last updated: 12/23/2008 Anxiety Asthma Fibromyalgia Hx of coumadin therpay for DVT.   Physical Exam  General:  Well-developed,well-nourished,in no acute distress; alert,appropriate and cooperative throughout examination Head:  Normocephalic and atraumatic without obvious abnormalities. No apparent alopecia or balding. Eyes:  No corneal  or conjunctival inflammation noted. EOMI. Perrla. Ears:  External ear exam shows no significant lesions or deformities.  Otoscopic examination reveals clear canals, tympanic membranes are intact bilaterally without bulging, retraction, inflammation or discharge. Hearing is grossly normal bilaterally. Nose:  External nasal examination shows no deformity or inflammation.  Mouth:  Oral mucosa and oropharynx without lesions or exudates.  Teeth in good repair. Neck:  No deformities, masses, or tenderness noted. Lungs:  Normal respiratory effort, chest expands symmetrically. Diffuse wheezing with poor airmovment.  Heart:  Normal rate and regular rhythm. S1 and S2 normal without gallop, murmur, click, rub or other extra sounds. Skin:  no rashes.   Cervical Nodes:  No lymphadenopathy noted Psych:  Cognition and judgment appear intact. Alert and cooperative with normal attention span and concentration. No apparent delusions, illusions, hallucinations. dysphoric affect.     Impression & Recommendations:   Problem # 1:  HEADACHE (ICD-784.0) Assessment New Unclear etiology. Clearly stress can constribute. She has avoided caffeine. OTC products not working. GIven shot of toradola dn phenergan for pain relief. Her father drove her here today. Also recommend lets rule out anemia, electrolyte imbalance, glucose abnormality and thyroid porblems. Labs in setptember were normal. Will get CXR to ruleout PNA. She says she is using her inhalers but has poor exam today. Peak flows were in the yellow but effort was poor.  Orders: T-Comprehensive Metabolic Panel [phone]) T-CBC w/Diff ([id number]) T-TSH [phone]) Promethazine up to 50mg  (J2550) Ketorolac-Toradol 15mg  (Z5638) Admin of Therapeutic Inj  intramuscular or subcutaneous (75643)  Problem # 2:  LEG CRAMPS (ICD-729.82) Assessment: Deteriorated Will rule out electrolyte imbalance, but may also be deconditioning since only really happens when she is trying to stretch. If normal then consider changing her cymbalta since she thinks it may be related.   Orders: T-Comprehensive Metabolic Panel [phone]) T-CBC w/Diff (60630-16010) T-TSH [phone])  Problem # 3:  ASTHMA (ICD-493.90) Peak flow in the yellow and her exam is abnormal but her effort was poor. will get a CXR to ruleout PNA with her history.  The following medications were removed from the medication list:    Prednisone 10 Mg Tabs (Prednisone) .Marland KitchenMarland KitchenMarland KitchenMarland Kitchen 4 a day for 5 days, 3 a day for 5 days, 2 a day for 5 days, 1 a day for 5 days, then 1/2 a tab for 4 days, then stop. Her updated medication list for this problem includes:    Proair Hfa 108 (90 Base) Mcg/act Aers (Albuterol sulfate) .Marland Kitchen... As directed    Albuterol Sulfate (5 Mg/ml) 0.5% Nebu (Albuterol sulfate) .Marland Kitchen... As directed    Proventil Hfa 108 (90 Base) Mcg/act Aers (Albuterol sulfate) .Marland Kitchen... As directed    Symbicort 160-4.5 Mcg/act Aero (Budesonide-formoterol fumarate) .Marland Kitchen... 2 puffs inhaled two times a day    Singulair 10 Mg  Tabs (Montelukast sodium) .Marland Kitchen... Take 1 tablet by mouth once a day  Orders: T-Chest x-ray, 2 views (71020) Peak Flow Rate (94150)  Problem # 4:  ANXIETY (ICD-300.00) Consider changing her cymbalta. Still recommend counseling. Appt is not until December.  Her updated medication list for this problem includes:    Xanax 1 Mg Tabs (Alprazolam) .Marland Kitchen... 1 tab by mouth two times a day to three times a day as needed    Cymbalta 30 Mg Cpep (Duloxetine hcl) .Marland Kitchen... Take 1 tablet by mouth once a day  Complete Medication List: 1)  Xanax 1 Mg Tabs (Alprazolam) .Marland Kitchen.. 1 tab by mouth two times a day to three times a day as needed 2)  Proair Hfa 108 (90 Base) Mcg/act Aers (Albuterol sulfate) .... As directed 3)  Albuterol Sulfate (5 Mg/ml) 0.5% Nebu (Albuterol sulfate) .... As directed 4)  Proventil Hfa 108 (90 Base) Mcg/act Aers (Albuterol sulfate) .... As directed 5)  Cymbalta 30 Mg Cpep (Duloxetine hcl) .... Take 1 tablet by mouth once a day 6)  Ambien 10 Mg Tabs (Zolpidem tartrate) .Marland Kitchen.. 1 by mouth hs as needed insomnia 7)  Symbicort 160-4.5 Mcg/act Aero (Budesonide-formoterol fumarate) .... 2 puffs inhaled two times a day 8)  Singulair 10 Mg Tabs (Montelukast sodium) .... Take 1 tablet by mouth once a day   Medication Administration  Injection # 1:    Medication: Promethazine up to 50mg     Diagnosis: HEADACHE (ICD-784.0)    Route: IM    Site: LUOQ gluteus    Exp Date: 03/08/2010    Lot #: [id number]    Mfr: Novaplus    Comments: pHENERGAN 25mg  Im    Patient tolerated injection without complications    Given by: Kathlene November (February 15, 2009 11:22 AM)  Injection # 2:    Medication: Ketorolac-Toradol 15mg     Diagnosis: HEADACHE (ICD-784.0)    Route: IM    Site: RUOQ gluteus    Exp Date: 10/06/2009    Lot #: [id number]    Mfr: Nova plus    Comments: Toradol 60mg  IM given    Patient tolerated injection without complications    Given by: Kathlene November (February 15, 2009 11:23 AM)  Orders Added:  1)  T-Comprehensive Metabolic Panel [[id number]] 2)  T-CBC w/Diff [[id number]] 3)  T-TSH [[id number]] 4)  T-Chest x-ray, 2 views [71020] 5)  Promethazine up to 50mg  [J2550] 6)  Ketorolac-Toradol 15mg  [J1885] 7)  Admin of Therapeutic Inj  intramuscular or subcutaneous [96372] 8)  Est. Patient Level IV [30865] 9)  Peak Flow Rate [94150]

## 2010-05-08 NOTE — Assessment & Plan Note (Signed)
Summary: NOT FEELING WELL   Vital Signs:  Patient profile:   45 year old female Height:      65 inches Weight:      191 pounds Pulse rate:   68 / minute BP sitting:   111 / 64  (left arm) Cuff size:   large  Vitals Entered By: Kathlene November (April 11, 2009 10:08 AM) CC: legs cramping, feet swelling and back pain   Primary Care Provider:  Nani Gasser MD  CC:  legs cramping and feet swelling and back pain.  History of Present Illness: legs cramping, feet swelling and back pain.  She know she is under stress.  Feels the muscles are extremely weak. When wake up in the morining feels like has been working all night. Doesn' t feel rested. Use of the muscles doesn't necessarily cause cause fatigue. Denies noticing if the weakness changes sides or locations. Feels it is allover. Frequently wakes up at night.  The leg cramping will wake her up sometimes. No ssensation of having to move her legs. [place] and one of her legs gave. Has been noticing this more frequently.   Feels the Remus Loffler does help.   Says her nausea is actually better on the PPI samples I gave her. Still taking her Paxil.    Current Medications (verified): 1)  Xanax 1 Mg Tabs (Alprazolam) .Marland Kitchen.. 1 Tab By Mouth Two Times A Day To Three Times A Day As Needed 2)  Proair Hfa 108 (90 Base) Mcg/act Aers (Albuterol Sulfate) .... As Directed 3)  Albuterol Sulfate (5 Mg/ml) 0.5% Nebu (Albuterol Sulfate) .... As Directed 4)  Proventil Hfa 108 (90 Base) Mcg/act Aers (Albuterol Sulfate) .... As Directed 5)  Paxil 40 Mg Tabs (Paroxetine Hcl) .... Take 1 Tablet By Mouth Once A Day 6)  Ambien 10 Mg Tabs (Zolpidem Tartrate) .Marland Kitchen.. 1 By Mouth Hs As Needed Insomnia 7)  Advair Diskus 500-50 Mcg/dose Misc (Fluticasone-Salmeterol) .... Take One (1) Inhalation Twice A Day  Allergies (verified): No Known Drug Allergies  Comments:  Nurse/Medical Assistant: The patient's medications and allergies were  reviewed with the patient and were updated in the Medication and Allergy Lists. Kathlene November (April 11, 2009 10:09 AM)  Social History: Reviewed history from 01/20/2009 and no changes required. Widowed (husband committed suicide).  Has 2 kids.  Currently living with her father.  Current Smoker Alcohol use-no Drug use-no Regular exercise-no  Physical Exam  General:  Well-developed,well-nourished,in no acute distress; alert,appropriate and cooperative throughout examination   Impression & Recommendations:  Problem # 1:  MUSCLE PAIN (ICD-729.1) Had a long conversation with patient about why I am sending her to a specialist. She has appt with rhuem tomorrow. She had not recieved the letter with her date and time so gave her this info today.  She needs to call to confirm. I do agree that something is going on and can consider, MS, fibromyalgia, or other msucles d/o but she doesn't seem to really fit one since pattern so I really want to get a specialist involved. WE do need to recheck her CK but since she is seeing rhuem tomorrow they may want to order labs. 25 min spent face to face and over half the time spent in counseling.   Problem # 2:  ANXIETY (ICD-300.00) We had referred her to psych and she never went. Discussed how important it is to also take care of her mental health.  Also discussed starting  abiligy since didn't tolerate the seroquel well. Warned about sedation and to hold off on the Ambien until tries the abiligy alone. Can continue her paxil.  Encourage her to call psych to schedule a new appt.  Her updated medication list for this problem includes:    Xanax 1 Mg Tabs (Alprazolam) .Marland Kitchen... 1 tab by mouth two times a day to three times a day as needed    Paxil 40 Mg Tabs (Paroxetine hcl) .Marland Kitchen... Take 1 tablet by mouth once a day  Complete Medication List: 1)  Xanax 1 Mg Tabs (Alprazolam) .Marland Kitchen.. 1 tab by mouth two times a day to three times a day as needed 2)  Proair Hfa 108 (90 Base)  Mcg/act Aers (Albuterol sulfate) .... As directed 3)  Albuterol Sulfate (5 Mg/ml) 0.5% Nebu (Albuterol sulfate) .... As directed 4)  Proventil Hfa 108 (90 Base) Mcg/act Aers (Albuterol sulfate) .... As directed 5)  Paxil 40 Mg Tabs (Paroxetine hcl) .... Take 1 tablet by mouth once a day 6)  Ambien 10 Mg Tabs (Zolpidem tartrate) .Marland Kitchen.. 1 by mouth hs as needed insomnia 7)  Advair Diskus 500-50 Mcg/dose Misc (Fluticasone-salmeterol) .... Take one (1) inhalation twice a day 8)  Abilify 2 Mg Tabs (Aripiprazole) .... Take 1 tablet by mouth once a day Prescriptions: XANAX 1 MG TABS (ALPRAZOLAM) 1 tab by mouth two times a day to three times a day as needed  #60 x 0   Entered and Authorized by:   Nani Gasser MD   Signed by:   Nani Gasser MD on 04/11/2009   Method used:   Printed then faxed to ...       [facility]  [place] [phone]* (retail)       [address] [place], [place]  [address]       Ph: [phone] or [phone]       Fax: [phone]   RxID:   [id number] ABILIFY 2 MG TABS (ARIPIPRAZOLE) Take 1 tablet by mouth once a day  #30 x 0   Entered and Authorized by:   Nani Gasser MD   Signed by:   Nani Gasser MD on 04/11/2009   Method used:   Electronically to        [facility] [phone]* (retail)       [address] [place], [place]  [address]       Ph: [phone] or [phone]       Fax: [phone]   RxID:   [id number]

## 2010-05-08 NOTE — Assessment & Plan Note (Signed)
Summary: ASTHMA exacerbation   Vital Signs:  Patient profile:   45 year old female Height:      65 inches Weight:      193 pounds O2 Sat:      99 % on Room air Pulse rate:   74 / minute BP sitting:   130 / 77  (left arm) Cuff size:   large  Vitals Entered By: Kathlene November (July 03, 2009 10:08 AM)  O2 Flow:  Room air  Serial Vital Signs/Assessments:                                PEF    PreRx  PostRx Time      O2 Sat  O2 Type     L/min  L/min  L/min   By 10:09 AM  99  %               190    210    160     Kim Johnson 10:24 AM                      130    190    260     Kim Johnson  Comments: 10:09 AM Pt in red zone By: Kathlene November  10:24 AM Pt in yellow zone By: Kathlene November   CC: asthma.   Primary Care Provider:  Nani Gasser MD  CC:  asthma..  History of Present Illness: Breathing started getting worse on Saturday about 3 days. ago . Says spring allergies do usually bother her. .  Low grade fever. No other URI sxs like ST, ear pain, etc.  Using her albuterol 5 x a day.  No other URI sxs. Using her NEB machine at home and is almost out of vials.  Just refilled her Advair.  Also wants refills on her Xanax and sleep aid.      Current Medications (verified): 1)  Xanax 1 Mg Tabs (Alprazolam) .Marland Kitchen.. 1 Tab By Mouth Two Times A Day To Three Times A Day As Needed 2)  Proair Hfa 108 (90 Base) Mcg/act Aers (Albuterol Sulfate) .... As Directed 3)  Albuterol Sulfate (5 Mg/ml) 0.5% Nebu (Albuterol Sulfate) .... As Directed 4)  Proventil Hfa 108 (90 Base) Mcg/act Aers (Albuterol Sulfate) .... As Directed 5)  Ambien 10 Mg Tabs (Zolpidem Tartrate) .Marland Kitchen.. 1 By Mouth Hs As Needed Insomnia 6)  Advair Diskus 500-50 Mcg/dose Misc (Fluticasone-Salmeterol) .... Take One (1) Inhalation Twice A Day 7)  Abilify 10 Mg Tabs (Aripiprazole) .... Take 1 Tablet By Mouth Once A Day 8)  Requip 0.25 Mg Tabs (Ropinirole Hcl) .Marland Kitchen.. 1-2 Tabs At in The Evening.  Allergies (verified): No Known Drug  Allergies  Comments:  Nurse/Medical Assistant: The patient's medications and allergies were reviewed with the patient and were updated in the Medication and Allergy Lists. Kathlene November (July 03, 2009 10:09 AM)  Social History: Reviewed history from 04/11/2009 and no changes required. Widowed (husband committed suicide).  Has 2 kids.  Currently living with her father.  Current Smoker Alcohol use-no Drug use-no Regular exercise-no  Physical Exam  General:  Well-developed,well-nourished,in no acute distress; alert,appropriate and cooperative throughout examination Head:  Normocephalic and atraumatic without obvious abnormalities. No apparent alopecia or balding. Eyes:  No corneal or conjunctival inflammation noted. EOMI. Perrla.  Ears:  External ear exam shows no significant lesions or deformities.  Otoscopic examination reveals clear canals,  tympanic membranes are intact bilaterally without bulging, retraction, inflammation or discharge. Hearing is grossly normal bilaterally. Nose:  External nasal examination shows no deformity or inflammation.  Mouth:  Oral mucosa and oropharynx without lesions or exudates.  Teeth in good repair. Lungs:  expiratory wheezing bilat.  Heart:  Normal rate and regular rhythm. S1 and S2 normal without gallop, murmur, click, rub or other extra sounds. Skin:  no rashes.   Cervical Nodes:  No lymphadenopathy noted Psych:  Cognition and judgment appear intact. Alert and cooperative with normal attention span and concentration. No apparent delusions, illusions, hallucinations   Impression & Recommendations:  Problem # 1:  ASTHMA (ICD-493.90) Discussed continuing her Advair and restart prednisone. i think allergies may be flaring her sxs so rec start OTC zyrtec daily. Take consistantly at least for 2-3 weeks. Also consider seeing a pulmonologist for her asthma, since she is flaring on teh highest dose of Adviar. She continue to smoke which clearly is  exacerbating her signs.  Her updated medication list for this problem includes:    Proair Hfa 108 (90 Base) Mcg/act Aers (Albuterol sulfate) .Marland Kitchen... As directed    Albuterol Sulfate (5 Mg/ml) 0.5% Nebu (Albuterol sulfate) .Marland Kitchen... As directed    Proventil Hfa 108 (90 Base) Mcg/act Aers (Albuterol sulfate) .Marland Kitchen... As directed    Advair Diskus 500-50 Mcg/dose Misc (Fluticasone-salmeterol) .Marland Kitchen... Take one (1) inhalation twice a day    Prednisone 10 Mg Tabs (Prednisone) .Marland KitchenMarland KitchenMarland KitchenMarland Kitchen 4 tabs by mouth once a day for 5 days, then 2 tabs daily for 5 days, then one a day for 5 days, then stop  Orders: Atrovent 1mg  (Neb) [phone]) Albuterol Sulfate Sol 1mg  unit dose (U0454) Nebulizer Tx (09811) Peak Flow Meter (B1478)  Complete Medication List: 1)  Xanax 1 Mg Tabs (Alprazolam) .Marland Kitchen.. 1 tab by mouth two times a day to three times a day as needed 2)  Proair Hfa 108 (90 Base) Mcg/act Aers (Albuterol sulfate) .... As directed 3)  Albuterol Sulfate (5 Mg/ml) 0.5% Nebu (Albuterol sulfate) .... As directed 4)  Proventil Hfa 108 (90 Base) Mcg/act Aers (Albuterol sulfate) .... As directed 5)  Ambien 10 Mg Tabs (Zolpidem tartrate) .Marland Kitchen.. 1 by mouth hs as needed insomnia 6)  Advair Diskus 500-50 Mcg/dose Misc (Fluticasone-salmeterol) .... Take one (1) inhalation twice a day 7)  Abilify 10 Mg Tabs (Aripiprazole) .... Take 1 tablet by mouth once a day 8)  Requip 0.25 Mg Tabs (Ropinirole hcl) .Marland Kitchen.. 1-2 tabs at in the evening. 9)  Prednisone 10 Mg Tabs (Prednisone) .... 4 tabs by mouth once a day for 5 days, then 2 tabs daily for 5 days, then one a day for 5 days, then stop  Patient Instructions: 1)  Start the steroids today 2)  Start OTC zyrtec once a day. (store brand is fine).  3)  May need to consider seeing a pulmonologist for your asthma.  4)  Go to the ED or Urgent [facility] if gets worse after hours.  Prescriptions: PREDNISONE 10 MG TABS (PREDNISONE) 4 tabs by mouth once a day for 5 days, then 2 tabs daily for 5 days, then one a  day for 5 days, then stop  #35 x 0   Entered and Authorized by:   Nani Gasser MD   Signed by:   Nani Gasser MD on 07/03/2009   Method used:   Printed then faxed to ...       [facility]  [address] [phone]* (retail)       [phone]  [address] [place], [place]  [address]       Ph: [phone] or [phone]       Fax: [phone]   RxID:   [id number] ALBUTEROL SULFATE (5 MG/ML) 0.5% NEBU (ALBUTEROL SULFATE) As directed  #150 Mill[place] x 3   Entered and Authorized by:   Nani Gasser MD   Signed by:   Nani Gasser MD on 07/03/2009   Method used:   Printed then faxed to ...       [facility]  [place] [phone]* (retail)       [address] [place], [place]  [address]       Ph: [phone] or [phone]       Fax: [phone]   RxID:   [id number] AMBIEN 10 MG TABS (ZOLPIDEM TARTRATE) 1 by mouth hs as needed insomnia  #30 x 0   Entered and Authorized by:   Nani Gasser MD   Signed by:   Nani Gasser MD on 07/03/2009   Method used:   Printed then faxed to ...       [facility]  [place] [phone][facility] (retail)       [address] [place], [place]  [address]       Ph: [phone] or [phone]       Fax: [phone]   RxID:   [id number] Prudy Feeler 1 MG TABS (ALPRAZOLAM) 1 tab by mouth two times a day to three times a day as needed  #60 x 0   Entered and Authorized by:   Nani Gasser MD   Signed by:   Nani Gasser MD on 07/03/2009   Method used:   Printed then faxed to ...       [facility]  [place] [phone][facility] ([facility]ail)       [address] [place], [place]  [address]       Ph: [phone] or [phone]       Fax: [phone]   RxID:   [id number]    Medication Administration  Medication # 1:    Medication: Atrovent 1mg  (Neb)    Diagnosis: ASTHMA (ICD-493.90)    Dose: 0.5mg     Route: inhaled    Exp Date: 11/07/2010    Lot #: [id number]    Mfr: Nephron    Patient tolerated medication without complications    Given by: Kathlene November (July 03, 2009 10:10 AM)  Medication # 2:    Medication: Albuterol Sulfate Sol 1mg  unit dose    Diagnosis: ASTHMA (ICD-493.90)    Dose: 2.5mg     Route: inhaled    Exp Date: 12/08/2010    Lot #: [id number]    Mfr: Nephron    Patient tolerated medication without complications    Given by: Kathlene November (July 03, 2009 10:11 AM)  Orders Added: 1)  Atrovent 1mg  (Neb) [K5397] 2)  Albuterol Sulfate Sol 1mg  unit dose [J7613] 3)  Nebulizer Tx [94640] 4)  Est. Patient Level IV [67341] 5)  Peak Flow Meter [P3790]

## 2010-05-08 NOTE — Letter (Signed)
Summary: Primary Care Consult Scheduled Letter  [place] [facility]  [address]   [place], [place] [address]   Phone: [phone]  Fax: [phone]      05/19/2009 MRN: [id number]  [facility] [address] [place], [place]  [address]    Dear Ms. Poellnitz,      We have scheduled an appointment for you.  At the recommendation of Dr.METHENEY , we have scheduled you a consult with DR Christell Constant, [facility],[place]  on APRIL 27,2011  at 8:30AM.  Their address is_[address],,[place] . The office phone number is [phone].  If this appointment day and time is not convenient for you, please feel free to call the office of the doctor you are being referred to at the number listed above and reschedule the appointment.     It is important for you to keep your scheduled appointments. We are here to make sure you are given good patient care. If you have questions or you have made changes to your appointment, please notify us at  [phone], ask for  HELEN.    Thank you,  Patient Care Coordinator [place] at [facility]

## 2010-05-08 NOTE — Progress Notes (Signed)
Summary: REFILL  Phone Note Refill Request Message from:  [facility]  Refills Requested: Medication #1:  ALPRAZOLAM 1MG  TAB #60   Last Refilled: 03/13/2009 TAKE 1 TAB 2-3 TIMES A DAY AS NEED   Method Requested: Electronic Initial call taken by: Vanessa Swaziland,  April 06, 2009 2:24 PM  Follow-up for Phone Call        Already filled on 12/6. Not due.  Follow-up by: Nani Gasser MD,  April 06, 2009 4:13 PM  Additional Follow-up for Phone Call Additional follow up Details #1::        no answer, no machine Additional Follow-up by: Alfred Levins, CMA,  April 06, 2009 4:46 PM

## 2010-05-08 NOTE — Assessment & Plan Note (Signed)
Summary: PNEUMONIA, no better, Anxiety   Vital Signs:  Patient profile:   45 year old female Height:      65 inches Weight:      189 pounds O2 Sat:      96 % on Room air Temp:     98.0 degrees F oral Pulse rate:   79 / minute BP sitting:   125 / 72  (left arm) Cuff size:   regular  Vitals Entered By: Kathlene November (January 05, 2009 10:32 AM)  O2 Flow:  Room air  Serial Vital Signs/Assessments:                                PEF    PreRx  PostRx Time      O2 Sat  O2 Type     L/min  L/min  L/min   By 10:59 AM                      170    170    170     Kim Johnson  Comments: 10:59 AM Pt in red zone By: Kathlene November   CC: followup- still having cough, wheezing, tired alot, no energy, low grade temp, H/A   Primary Care Provider:  Nani Gasser MD  CC:  followup- still having cough, wheezing, tired alot, no energy, low grade temp, and H/A.  History of Present Illness: Doing her NEBS about every 2 hours last night.  Stil having a cough, sounds croupy.  Using her inhaler today. Low grade fever 2 nights ago. HA and and burning in her eyes.  Completed a course of levaquin and doxycycline and prednisone. Still wheezing and having alot of difficulty at night. NO taking any OTC meds. Want to know if can take mucinex.    Say her mood is really down. REally stressed. Doens't know what to do.  Says her social situation is really bad right now. Having to liver with her fathe who is a chain smoker and this is aggrevating her lungs. On cymbalta and using her xanax. REferred her to psych but couldn't get appt until December.   Current Medications (verified): 1)  Xanax 1 Mg Tabs (Alprazolam) .Marland Kitchen.. 1 Tab By Mouth Two Times A Day To Three Times A Day As Needed 2)  Proair Hfa 108 (90 Base) Mcg/act Aers (Albuterol Sulfate) .... As Directed 3)  Albuterol Sulfate (5 Mg/ml) 0.5% Nebu (Albuterol Sulfate) .... As Directed 4)  Proventil Hfa 108 (90 Base) Mcg/act Aers (Albuterol Sulfate) .... As  Directed 5)  Cymbalta 20 Mg Cpep (Duloxetine Hcl) .Marland Kitchen.. 1 Tab By Mouth Once Daily 6)  Ambien 10 Mg Tabs (Zolpidem Tartrate) .Marland Kitchen.. 1 By Mouth Hs As Needed Insomnia  Allergies (verified): No Known Drug Allergies  Comments:  Nurse/Medical Assistant: The patient's medications and allergies were reviewed with the patient and were updated in the Medication and Allergy Lists. Kathlene November (January 05, 2009 10:34 AM)  Physical Exam  General:  Well-developed,well-nourished,in no acute distress; alert,appropriate and cooperative throughout examination Head:  Normocephalic and atraumatic without obvious abnormalities. No apparent alopecia or balding. Lungs:  Coarse BS bialterally with expiratory wheezing.  Heart:  Normal rate and regular rhythm. S1 and S2 normal without gallop, murmur, click, rub or other extra sounds. Pulses:  RAdial 2+  Skin:  no rashes.   Cervical Nodes:  No lymphadenopathy noted Psych:  Cognition and judgment appear intact. Alert  and cooperative with normal attention span and concentration. No apparent delusions, illusions, hallucinations   Impression & Recommendations:  Problem # 1:  PNEUMONIA (ICD-486) Assessment Deteriorated  Still not improving. Will get another CXR today.  Will give Depo-Medrol 80mg  IM x 1.  IM rochepin 2 grams IM. Conitnue NEBS every 4 hours. Will call wiht CXR results. Peak flow is in the red zone which is concerning. She is not hypotensive or tachypneic. Her vitals are stable. Will call her back this afternoon with further care plan once have CXR.  The following medications were removed from the medication list:    Levaquin 500 Mg Tabs (Levofloxacin) ..... One by mouth once daily    Doxycycline Hyclate 100 Mg Caps (Doxycycline hyclate) .Marland Kitchen... Take 1 tablet by mouth two times a day for 10 days  Orders: T-Chest x-ray, 2 views (71020) Rocephin  250mg  (J1914) Depo- Medrol 80mg  (J1040) Admin of Therapeutic Inj  intramuscular or subcutaneous (78295)  Peak Flow Rate (94150)  Problem # 2:  ANXIETY (ICD-300.00)  Psychiatray appt is not until December.  Will increase cymbalta to 30mg  daily.  Will also work on getting her into counseling. Her social situation  is really difficulty right now. She didn't really want to go into details. Fu in 1 months.  Her updated medication list for this problem includes:    Xanax 1 Mg Tabs (Alprazolam) .Marland Kitchen... 1 tab by mouth two times a day to three times a day as needed    Cymbalta 30 Mg Cpep (Duloxetine hcl) .Marland Kitchen... Take 1 tablet by mouth once a day  Orders: Psychology Referral (Psychology)  Complete Medication List: 1)  Xanax 1 Mg Tabs (Alprazolam) .Marland Kitchen.. 1 tab by mouth two times a day to three times a day as needed 2)  Proair Hfa 108 (90 Base) Mcg/act Aers (Albuterol sulfate) .... As directed 3)  Albuterol Sulfate (5 Mg/ml) 0.5% Nebu (Albuterol sulfate) .... As directed 4)  Proventil Hfa 108 (90 Base) Mcg/act Aers (Albuterol sulfate) .... As directed 5)  Cymbalta 30 Mg Cpep (Duloxetine hcl) .... Take 1 tablet by mouth once a day 6)  Ambien 10 Mg Tabs (Zolpidem tartrate) .Marland Kitchen.. 1 by mouth hs as needed insomnia Prescriptions: CYMBALTA 30 MG CPEP (DULOXETINE HCL) Take 1 tablet by mouth once a day  #30 x 0   Entered and Authorized by:   Nani Gasser MD   Signed by:   Nani Gasser MD on 01/05/2009   Method used:   [facility]        [facility] [phone]* (retail)       [address] [place], [place]  [address]       Ph: [phone] or [phone]       Fax: [phone]   RxID:   [id number]    Medication Administration  Injection # 1:    Medication: Rocephin  250mg     Diagnosis: PNEUMONIA (ICD-486)    Route: IM    Site: RUOQ gluteus    Exp Date: 07/08/2011    Lot #: [id number]    Mfr: Novaplus    Comments: Rocephin 2 grams given    Patient tolerated injection without complications    Given by: Kathlene November (January 05, 2009 11:00 AM)  Injection # 2:    Medication: Depo-  Medrol 80mg     Diagnosis: PNEUMONIA (ICD-486)    Route: IM    Site: LUOQ gluteus    Exp Date: 01/06/2009  Lot #: [id number]    Mfr: [email or web address]    Patient tolerated injection without complications    Given by: Kathlene November (January 05, 2009 11:00 AM)  Orders Added: 1)  T-Chest x-ray, 2 views [71020] 2)  Est. Patient Level IV [54098] 3)  Rocephin  250mg  [J0696] 4)  Depo- Medrol 80mg  [J1040] 5)  Admin of Therapeutic Inj  intramuscular or subcutaneous [96372] 6)  Peak Flow Rate [94150] 7)  Psychology Referral [Psychology]

## 2010-05-08 NOTE — Progress Notes (Signed)
Summary: Dissmissal Letter Mailed Certified on 06/21/09  Dismissal Letter sent by certified mail. Vara Guardian  June 20, 2009 4:21 PM

## 2010-05-08 NOTE — Letter (Signed)
Summary: Letter-Discharge  Letter-Discharge   Imported By: Vanessa Swaziland 06/19/2009 15:52:29  _____________________________________________________________________  External Attachment:    Type:   Image     Comment:   Internal Document

## 2010-05-08 NOTE — Progress Notes (Signed)
Summary: Rheum referral  ---- Converted from flag ---- ---- 05/22/2009 3:14 PM, Charolett Bumpers wrote: Please refer to Rheumatology Referral (Orders dated 03/14/2009) to set up a referral for this patient with a Rheumatologist in [facility].  The patient can be reached at [phone].  She was not able to make the prior referrals to [facility] due to transportation issues.  Please advise staff at Rheumatologist office to notify Dr Shelah Lewandowsky office if patient is a no show for the appointment that you make for her,  please let me know if you have any questions. ------------------------------

## 2010-05-08 NOTE — Progress Notes (Signed)
  Phone Note Call from Patient   Caller: Patient Summary of Call: feeling worse, not getting any better. Initial call taken by: Syliva Overman MD,  January 08, 2009 10:23 PM  Follow-up for Phone Call        pt caslled on 01/07/2009 stating that she had initially been dx with pneumonia approx 3 weeks ago, and despite 2 different antibiotic courses, she reported headache , fever, chills, dyspnea, she states she was recently evaluated i n the office and was started on symbicort, wants to know what to do. I advised that since she reported feelingworse, she should go either to the yrgent care or the Ed, she chosethe urgent care, and stated that she would go for re-eval Follow-up by: Syliva Overman MD,  January 08, 2009 10:26 PM

## 2010-05-08 NOTE — Progress Notes (Signed)
----   Converted from flag ---- ---- 05/22/2009 3:14 PM, Charolett Bumpers wrote: Please refer to Rheumatology Referral (Orders dated 03/14/2009) to set up a referral for this patient with a Rheumatologist in [facility].  The patient can be reached at [phone].  She was not able to make the prior referrals to [facility] due to transportation issues.  Please advise staff at Rheumatologist office to notify Dr Shelah Lewandowsky office if patient is a no show for the appointment that you make for her,  please let me know if you have any questions. ------------------------------  Spoke with Huntley Dec at Dr. Hardie Shackleton office in [place] Faxed records and labs for MD to review. Once reviewed will call pt and [place] to let us know of appointment time and date or that MD declines to see patient.KJ LPN 6/44/0347 @ 9:08am

## 2010-05-08 NOTE — Progress Notes (Signed)
Summary: Referral  Phone Note Call from Patient   Caller: Patient Summary of Call: Pt needs referral to Dr. Orie Rout Initial call taken by: Payton Spark CMA,  July 19, 2009 3:23 PM  Follow-up for Phone Call        OK.  Follow-up by: Nani Gasser MD,  July 19, 2009 4:05 PM  New Problems: LEG PAIN, BILATERAL (ICD-729.5)   New Problems: LEG PAIN, BILATERAL (ICD-729.5)

## 2010-05-08 NOTE — Letter (Signed)
Summary: Appt Scheduled/[facility]  Appt Scheduled/[facility]   Imported By: Lanelle Bal 06/19/2009 12:30:25  _____________________________________________________________________  External Attachment:    Type:   Image     Comment:   External Document

## 2010-05-08 NOTE — Assessment & Plan Note (Signed)
Summary: ASTHMA/KH   Vital Signs:  Patient Profile:   45 Years Old Female CC:      Chest Tightness , congestion x 3 days Height:     65 inches Weight:      183 pounds O2 Sat:      92 % O2 treatment:    Room Air Temp:     96.8 degrees F oral Pulse rate:   83 / minute Pulse rhythm:   regular Resp:     14 per minute BP sitting:   133 / 70  (right arm) Cuff size:   regular  Vitals Entered By: Areta Haber CMA (December 09, 2008 3:17 PM)                  Current Allergies: No known allergies History of Present Illness Chief Complaint: Chest Tightness , congestion x 3 days History of Present Illness: Subjective:  Patient complains of 3 to 4 day history of URI symptoms that started with mild sore throat and nasal congestion followed by a persistent non-productive cough.  She has had frequent shortness of breath and wheezing, responding poorly to her albuterol MDI and nebulizer.  She also uses Advair 250/50.  Last night she developed pleuritic pain in her left anterior chest.  She states that she has had several episodes of pneumonia in the past.   She denies fevers, chills, and sweats   Current Problems: COUGH, NON-PRODUCTIVE (ICD-786.2) PAINFUL RESPIRATION (ICD-786.52) ASTHMA (ICD-493.90) ANXIETY (ICD-300.00)   Current Meds XANAX 1 MG TABS (ALPRAZOLAM) 1 tab by mouth  three times a day PROAIR HFA 108 (90 BASE) MCG/ACT AERS (ALBUTEROL SULFATE) As directed ALBUTEROL SULFATE (5 MG/ML) 0.5% NEBU (ALBUTEROL SULFATE) As directed PROVENTIL HFA 108 (90 BASE) MCG/ACT AERS (ALBUTEROL SULFATE) As directed CYMBALTA 20 MG CPEP (DULOXETINE HCL) 1 tab by mouth once daily AMBIEN 10 MG TABS (ZOLPIDEM TARTRATE) 1 by mouth hs as needed insomnia CLARITHROMYCIN 500 MG TABS (CLARITHROMYCIN) One Tab by mouth two times a day PREDNISONE 10 MG TABS (PREDNISONE) 2 PO BID for 3 days, then 1 BID for 2 days, then 1 daily for 2 days.  Take PC BENZONATATE 200 MG CAPS (BENZONATATE) One by mouth hs as  needed cough  REVIEW OF SYSTEMS Constitutional Symptoms      Denies fever, chills, night sweats, weight loss, weight gain, and fatigue.  Eyes       Denies change in vision, eye pain, eye discharge, glasses, contact lenses, and eye surgery. Ear/Nose/Throat/Mouth       Denies hearing loss/aids, change in hearing, ear pain, ear discharge, dizziness, frequent runny nose, frequent nose bleeds, sinus problems, sore throat, hoarseness, and tooth pain or bleeding.  Respiratory       Complains of wheezing and shortness of breath.      Denies dry cough, productive cough, asthma, bronchitis, and emphysema/COPD.  Cardiovascular       Denies murmurs, chest pain, and tires easily with exhertion.      Comments: Chest Tightness   Gastrointestinal       Denies stomach pain, nausea/vomiting, diarrhea, constipation, blood in bowel movements, and indigestion. Genitourniary       Denies painful urination, kidney stones, and loss of urinary control. Neurological       Denies paralysis, seizures, and fainting/blackouts. Musculoskeletal       Denies muscle pain, joint pain, joint stiffness, decreased range of motion, redness, swelling, muscle weakness, and gout.  Skin       Denies bruising, unusual mles/lumps  or sores, and hair/skin or nail changes.  Psych       Denies mood changes, temper/anger issues, anxiety/stress, speech problems, depression, and sleep problems.  Past History:  Past Medical History: Anxiety Asthma Fibromyalgia    Objective:  No acute distress  Eyes:  Pupils are equal, round, and reactive to light and accomdation.  Extraocular movement is intact.  Conjunctivae are not inflamed.  Ears:  Canals normal.  Tympanic membranes normal.   Nose:  Normal septum.  Normal turbinates, mildly congested.  No sinus tenderness present.  Pharynx:  Normal Neck:  Supple.  No adenopathy is present.  No thyromegaly is present  Lungs:  Diffuse wheezes all fields.   Breath sounds are equal.  No rales.    Chest:  There is tenderness left anterior/inferior ribs. Abdomen:  Nontender without masses or hepatosplenomegaly.  Bowel sounds are present.  No CVA or flank tenderness.  Extremities:  No edema.   Skin:  No rash Chest X-ray:  patchy opacity left lung base:  ? atelectasis vs ? pneumonia Assessment New Problems: COUGH, NON-PRODUCTIVE (ICD-786.2) PAINFUL RESPIRATION (ICD-786.52) ASTHMA (ICD-493.90) ANXIETY (ICD-300.00)  Suspect pneumonia  Plan New Medications/Changes: BENZONATATE 200 MG CAPS (BENZONATATE) One by mouth hs as needed cough  #12 x 0, 12/09/2008, Donna Christen MD PREDNISONE 10 MG TABS (PREDNISONE) 2 PO BID for 3 days, then 1 BID for 2 days, then 1 daily for 2 days.  Take PC  #18 x 0, 12/09/2008, Donna Christen MD CLARITHROMYCIN 500 MG TABS (CLARITHROMYCIN) One Tab by mouth two times a day  #20 x 0, 12/09/2008, Donna Christen MD AMBIEN 10 MG TABS (ZOLPIDEM TARTRATE) 1 by mouth hs as needed insomnia  #15 x 0, 12/09/2008, Donna Christen MD  New Orders: T-DG Ribs Unilateral w/Chest*L* [71101] Rocephin  250mg  [H8469] New Patient Level IV [62952] Planning Comments:   Rocephin 1gm IM.  Begin Biaxin.  Tapering course of prednisone.  Continue inhaler.  Expectorant daytime.  Cough suppressant at night.  Increase fluid intake. Follow-up with PCP approximately one week. If symptoms become significantly worse during the night or over the weekend, proceed to the local emergency room.   The patient and/or caregiver has been counseled thoroughly with regard to medications prescribed including dosage, schedule, interactions, rationale for use, and possible side effects and they verbalize understanding.  Diagnoses and expected course of recovery discussed and will return if not improved as expected or if the condition worsens. Patient and/or caregiver verbalized understanding.  Prescriptions: BENZONATATE 200 MG CAPS (BENZONATATE) One by mouth hs as needed cough  #12 x 0   Entered and  Authorized by:   Donna Christen MD   Signed by:   Donna Christen MD on 12/09/2008   Method used:   Print then Give to Patient   RxID:   [id number] PREDNISONE 10 MG TABS (PREDNISONE) 2 PO BID for 3 days, then 1 BID for 2 days, then 1 daily for 2 days.  Take PC  #18 x 0   Entered and Authorized by:   Donna Christen MD   Signed by:   Donna Christen MD on 12/09/2008   Method used:   Print then Give to Patient   RxID:   [id number] CLARITHROMYCIN 500 MG TABS (CLARITHROMYCIN) One Tab by mouth two times a day  #20 x 0   Entered and Authorized by:   Donna Christen MD   Signed by:   Donna Christen MD on 12/09/2008   Method used:   Print then Give to Patient  RxID:   [id number] AMBIEN 10 MG TABS (ZOLPIDEM TARTRATE) 1 by mouth hs as needed insomnia  #15 x 0   Entered and Authorized by:   Donna Christen MD   Signed by:   Donna Christen MD on 12/09/2008   Method used:   Print then Give to Patient   RxID:   [id number]   Patient Instructions: 1)  May use Mucinex  twice daily for congestion  2)  Increase fluid intake.  Rest.  3)  Follow-up with family doctor approximately one week.   Medication Administration  Injection # 1:    Medication: Rocephin  250mg     Diagnosis: COUGH, NON-PRODUCTIVE (ICD-786.2)    Route: IM    Site: RUOQ gluteus    Exp Date: 06/2011    Lot #: [id number]    Mfr: [email or web address]    Comments: 1 gm Rocephin given IM RUOQ gluteus per MD orders    Patient tolerated injection without complications    Given by: Angie Fava (December 09, 2008 5:14 PM)  Orders Added: 1)  T-DG Ribs Unilateral w/Chest*L* [71101] 2)  Rocephin  250mg  [J0696] 3)  New Patient Level IV [36644]

## 2010-05-08 NOTE — Progress Notes (Signed)
Summary: fyi  NO SHOW at behavioral health  Phone Note From Other Clinic   Caller: samatha from Behavior [phone] Summary of Call: pt has NO Showed a couple of appts and they will not be seeing her any questions can give a call Initial call taken by: Kandice Hams,  May 15, 2009 4:48 PM

## 2010-05-08 NOTE — Assessment & Plan Note (Signed)
Summary: Muslce pain, nausea, etc   Vital Signs:  Patient profile:   45 year old female Height:      65 inches Weight:      189 pounds O2 Sat:      97 % on Room air Temp:     98.3 degrees F oral Pulse rate:   63 / minute BP sitting:   131 / 71  (left arm) Cuff size:   large  Vitals Entered By: Kathlene November (March 13, 2009 10:52 AM)  O2 Flow:  Room air  Serial Vital Signs/Assessments:                                PEF    PreRx  PostRx Time      O2 Sat  O2 Type     L/min  L/min  L/min   By 11:25 AM                      220    220    240     Kim Johnson  Comments: 11:25 AM Pt in yellow zone By: Kathlene November   CC: fro 2 weeks now has muscle weaknwess in legs and "just doesn't feel right"   Primary Care Provider:  Nani Gasser MD  CC:  fro 2 weeks now has muscle weaknwess in legs and "just doesn't feel right".  History of Present Illness: fro 2 weeks now has muscle weaknwess in legs and "just doesn't feel right".  feels nauseated.  Feels like her muscle. Has been nauseated for over a month.  It is constant.   Occ vomits, happened yesterday. No abdominal pain.  Feels it is not the medication.  REally started before we started the prozac. When moves head feels dizzy. No room spinning or near syncope. Feels like the muscles in her back are tight. No fever. No diarrhea. Occ having palpitations. Not sleeping well. Fearful to sleep but feels she is "fighting"in her lseep. Still wheezing andhas been using the Advair.   Current Medications (verified): 1)  Xanax 1 Mg Tabs (Alprazolam) .Marland Kitchen.. 1 Tab By Mouth Two Times A Day To Three Times A Day As Needed 2)  Proair Hfa 108 (90 Base) Mcg/act Aers (Albuterol Sulfate) .... As Directed 3)  Albuterol Sulfate (5 Mg/ml) 0.5% Nebu (Albuterol Sulfate) .... As Directed 4)  Proventil Hfa 108 (90 Base) Mcg/act Aers (Albuterol Sulfate) .... As Directed 5)  Paxil 20 Mg Tabs (Paroxetine Hcl) .... 1/2 Tab Once A Day For 1 Week, Then 1 Tab Once A  Day. 6)  Ambien 10 Mg Tabs (Zolpidem Tartrate) .Marland Kitchen.. 1 By Mouth Hs As Needed Insomnia  Allergies (verified): No Known Drug Allergies  Comments:  Nurse/Medical Assistant: The patient's medications and allergies were reviewed with the patient and were updated in the Medication and Allergy Lists. Kathlene November (March 13, 2009 10:53 AM)  Physical Exam  General:  Well-developed,well-nourished,in no acute distress; alert,appropriate and cooperative throughout examination Head:  Normocephalic and atraumatic without obvious abnormalities. No apparent alopecia or balding. Eyes:  No corneal or conjunctival inflammation noted. EOMI. Perrla. Ears:  External ear exam shows no significant lesions or deformities.  Otoscopic examination reveals clear canals, tympanic membranes are intact bilaterally without bulging, retraction, inflammation or discharge. Hearing is grossly normal bilaterally. Nose:  External nasal examination shows no deformity or inflammation.  Mouth:  Oral mucosa and oropharynx without lesions or  exudates.  Teeth in good repair. Neck:  No deformities, masses, or tenderness noted. Lungs:  Normal respiratory effort, chest expands symmetrically.expiratory wheeze bilaterally at the bases.  Heart:  Normal rate and regular rhythm. S1 and S2 normal without gallop, murmur, click, rub or other extra sounds. Neurologic:  alert & oriented X3 and cranial nerves II-XII intact.  Diks hallpike cuased dizziness to the right. Unable to see if had vertigo as pt shut her eyes.  Skin:  no rashes.   Cervical Nodes:  No lymphadenopathy noted Psych:  Cognition and judgment appear intact. Alert and cooperative with normal attention span and concentration. No apparent delusions, illusions, hallucinations   Impression & Recommendations:  Problem # 1:  MUSCLE PAIN (ICD-729.1) Will rule out elevated CK nad sed rate. CBC, CMP, TSH from 1 mo ago were normal. If these are normal consider a dx of fibromyalgia.  Can consider a low dose muscle relaxer at bedtime.  Orders: T-CK Total [phone]) T-Sed Rate (Automated) [phone]) T-CRP (C-Reactive Protein) (29562) T-Iron [phone]) T-Iron Binding Capacity (TIBC) ([address])  Problem # 2:  ASTHMA (ICD-493.90)  Clearly not well controlled.  INcrease Advair to 500. She also continues to smoke. Will treat with steroid taper as well and see if this helps. It may even help her rest better as her asthma may be part of what is waking her up.  The following medications were removed from the medication list:    Symbicort 160-4.5 Mcg/act Aero (Budesonide-formoterol fumarate) .Marland Kitchen... 2 puffs inhaled two times a day    Singulair 10 Mg Tabs (Montelukast sodium) .Marland Kitchen... Take 1 tablet by mouth once a day Her updated medication list for this problem includes:    Proair Hfa 108 (90 Base) Mcg/act Aers (Albuterol sulfate) .Marland Kitchen... As directed    Albuterol Sulfate (5 Mg/ml) 0.5% Nebu (Albuterol sulfate) .Marland Kitchen... As directed    Proventil Hfa 108 (90 Base) Mcg/act Aers (Albuterol sulfate) .Marland Kitchen... As directed    Advair Diskus 250-50 Mcg/dose Aepb (Fluticasone-salmeterol) ..... One inhalation two times a day    Advair Diskus 500-50 Mcg/dose Misc (Fluticasone-salmeterol) .Marland Kitchen... Take one (1) inhalation twice a day    Prednisone 10 Mg Tabs (Prednisone) .Marland KitchenMarland KitchenMarland KitchenMarland Kitchen 4 by mouth once a day for 5 days, then 3 once a day for 4 days, 2 a day for 3 days, 1 a day for 2 days then stop.  Orders: Peak Flow Rate (94150)  Problem # 3:  NAUSEA (ICD-787.02) Likely GERD/Gastritis based on her sxs. Will give samples of a PPI to take before breakfast. Call in 4-5 days to let me know if helping.   Problem # 4:  VERTIGO (ICD-780.4) she did get dizziness to the right with the [facility]. Gave her a H.O. on exercises to do at home to see if helps her sxs. Her ears look great.   Complete Medication List: 1)  Xanax 1 Mg Tabs (Alprazolam) .Marland Kitchen.. 1 tab by mouth two times a day to three times a day as  needed 2)  Proair Hfa 108 (90 Base) Mcg/act Aers (Albuterol sulfate) .... As directed 3)  Albuterol Sulfate (5 Mg/ml) 0.5% Nebu (Albuterol sulfate) .... As directed 4)  Proventil Hfa 108 (90 Base) Mcg/act Aers (Albuterol sulfate) .... As directed 5)  Paxil 40 Mg Tabs (Paroxetine hcl) .... Take 1 tablet by mouth once a day 6)  Ambien 10 Mg Tabs (Zolpidem tartrate) .Marland Kitchen.. 1 by mouth hs as needed insomnia 7)  Advair Diskus 250-50 Mcg/dose Aepb (Fluticasone-salmeterol) .... One inhalation two times a day  8)  Advair Diskus 500-50 Mcg/dose Misc (Fluticasone-salmeterol) .... Take one (1) inhalation twice a day 9)  Prednisone 10 Mg Tabs (Prednisone) .... 4 by mouth once a day for 5 days, then 3 once a day for 4 days, 2 a day for 3 days, 1 a day for 2 days then stop. 10)  Dexilant 30 Mg Cpdr (Dexlansoprazole) .... Take 1 tablet by mouth once a day  Patient Instructions: 1)  Try the samples of the  stomach medicine (kapidex) once a day about 20 minutes before breakfast.  2)  Get labwork.  3)  If wheezing not better by the end of teh week then call and we will get a chest xray.   4)  INcrease Advair to 500 - still one inhalation two times a day 5)  Please do the vertigo exercises on the handout. Do the exercise 10 times twice a day .  Prescriptions: PAXIL 40 MG TABS (PAROXETINE HCL) Take 1 tablet by mouth once a day  #30 x 1   Entered and Authorized by:   Nani Gasser MD   Signed by:   Nani Gasser MD on 03/13/2009   Method used:   Printed then faxed to ...       [facility]  [place] [phone]* (retail)       [address] [place], [place]  [address]       Ph: [phone] or [phone]       Fax: [phone]   RxID:   [id number] Prudy Feeler 1 MG TABS (ALPRAZOLAM) 1 tab by mouth two times a day to three times a day as needed  #60 x 0   Entered and Authorized by:   Nani Gasser MD   Signed by:   Nani Gasser MD on 03/13/2009   Method used:   Print then Give to Patient   RxID:    [id number] DEXILANT 30 MG CPDR (DEXLANSOPRAZOLE) Take 1 tablet by mouth once a day  #15 x 0   Entered and Authorized by:   Nani Gasser MD   Signed by:   Nani Gasser MD on 03/13/2009   Method used:   Print then Give to Patient   RxID:   [id number] PREDNISONE 10 MG TABS (PREDNISONE) 4 by mouth once a day for 5 days, then 3 once a day for 4 days, 2 a day for 3 days, 1 a day for 2 days then stop.  #40 x 0   Entered and Authorized by:   Nani Gasser MD   Signed by:   Nani Gasser MD on 03/13/2009   Method used:   Print then Give to Patient   RxID:   [id number] ADVAIR DISKUS 500-50 MCG/DOSE MISC (FLUTICASONE-SALMETEROL) Take one (1) inhalation twice a day  #1 x 3   Entered and Authorized by:   Nani Gasser MD   Signed by:   Nani Gasser MD on 03/13/2009   Method used:   Print then Give to Patient   RxID:   [id number]

## 2010-05-08 NOTE — Progress Notes (Signed)
Summary: Med  Phone Note Call from Patient   Caller: Patient Summary of Call: Dr.Paisli Silfies   Call [phone] daughter phone number  Patient said her and Dr.Joseeduardo Brix talked yesterday about starting her on a Med and then she decided she didnt want it....called back today and she wants to try the med.    Also she is question her RX that was sent to the pharmacy. Initial call taken by: Vanessa [place],  May 12, 2009 1:19 PM  Follow-up for Phone Call        Which med didn't get to the pharmacy?  Follow-up by: Nani Gasser MD,  May 12, 2009 1:37 PM  Additional Follow-up for Phone Call Additional follow up Details #1::        She wants to change to the savella.  Additional Follow-up by: Nani Gasser MD,  May 12, 2009 2:52 PM         Patient Instructions: 1)  Cut paxil in half for 7 days then stop for one week and then can start the savella.   Appended Document: Med Answer or machine at the above #.Arvilla Market CMA, Michelle May 15, 2009 4:38 PM

## 2010-05-08 NOTE — Progress Notes (Signed)
Summary: Apt for pysch  Phone Note From Other Clinic   Caller: Myriam Jacobson Details for Reason: Schedule appt. Summary of Call: Appointment with Dr Christell Constant has been rescheduled for April 27th  8:30am   Patient has been notified.    Patient has NS/Cancelled  with Dr Christell Constant 3 appointments   .  If this appointment is not kept they will not reschedule any future appointments.  Office  called to put on cancellation list for early appointment.  Initial call taken by: Darral Dash,  May 22, 2009 11:29 AM

## 2010-05-08 NOTE — Assessment & Plan Note (Signed)
Summary: NOV: left foot cellulitis, Fu PNA   Vital Signs:  Patient profile:   45 year old female Height:      65 inches Weight:      190 pounds O2 Sat:      98 % Temp:     98.6 degrees F oral BP sitting:   124 / 73  (left arm) Cuff size:   regular  Vitals Entered By: Kathlene November (December 23, 2008 11:39 AM) CC: had pneumonia now left foot and ankle swollen, red and painful   Primary Care Provider:  Nani Gasser MD  CC:  had pneumonia now left foot and ankle swollen and red and painful.  History of Present Illness: had pneumonia now left foot and ankle swollen, red and painful.  Was on Biaxin and then changed to Levaquin. Still on Levaquin. Still having a fever.  Woke up with red painful left anklt 2 days ago.  Anke has been throbbing has never happened before. Ankle has been throbbing. Started taking Tylenol for the fever.  IDid help her fever. Painful to walk on her ankle. No trauma or injury. No hx of diabetes.   Would also like a refill on her xanax and her Ambien.  Todl hewr I need records from her prior PCP.   Habits & Providers  Alcohol-Tobacco-Diet     Tobacco Status: current  Exercise-Depression-Behavior     Does Patient Exercise: no     Drug Use: no  Current Medications (verified): 1)  Xanax 1 Mg Tabs (Alprazolam) .Marland Kitchen.. 1 Tab By Mouth Two Times A Day To Three Times A Day As Needed 2)  Proair Hfa 108 (90 Base) Mcg/act Aers (Albuterol Sulfate) .... As Directed 3)  Albuterol Sulfate (5 Mg/ml) 0.5% Nebu (Albuterol Sulfate) .... As Directed 4)  Proventil Hfa 108 (90 Base) Mcg/act Aers (Albuterol Sulfate) .... As Directed 5)  Cymbalta 20 Mg Cpep (Duloxetine Hcl) .Marland Kitchen.. 1 Tab By Mouth Once Daily 6)  Ambien 10 Mg Tabs (Zolpidem Tartrate) .Marland Kitchen.. 1 By Mouth Hs As Needed Insomnia 7)  Prednisone 10 Mg Tabs (Prednisone) .... 2 Po Bid For 3 Days, Then 1 Bid For 2 Days, Then 1 Daily For 2 Days.  Take Pc 8)  Benzonatate 200 Mg Caps (Benzonatate) .... One By Mouth Hs As Needed  Cough 9)  Levaquin 500 Mg Tabs (Levofloxacin) .... One By Mouth Once Daily  Allergies (verified): No Known Drug Allergies  Comments:  Nurse/Medical Assistant: The patient's medications and allergies were reviewed with the patient and were updated in the Medication and Allergy Lists. Kathlene November (December 23, 2008 11:40 AM)  Past History:  Past Medical History: Anxiety Asthma Fibromyalgia Hx of coumadin therpay for DVT.   Past Surgical History: Appencectomy  Hysterectomy   Family History: None  Social History: Widowed.  Has 2 kids.   Current Smoker Alcohol use-no Drug use-no Regular exercise-no Smoking Status:  current Drug Use:  no Does Patient Exercise:  no  Review of Systems       + fever/sweats/weakness, no unexplained weight loss/gain.  No vison changes.  No difficulty hearing/ringing in ears, hay fever/allergies.  No chest pain/discomfort, palpitations.  + Br lump/nipple discharge.  + cough/wheeze.  No blood in BM, nausea/vomiting/diarrhea.  No nighttime urination, + leaking urine, no unusual vaginal bleeding, discharge (penis or vagina).  No muscle/joint pain. No rash, change in mole.  No HA, memory loss.  + anxiety, sleep d/o, depression.  No easy bruising/bleeding, unexplained lump   Physical Exam  General:  Well-developed,well-nourished,in no acute distress; alert,appropriate and cooperative throughout examination Head:  Normocephalic and atraumatic without obvious abnormalities. No apparent alopecia or balding. Eyes:  No corneal or conjunctival inflammation noted. EOMI. Perrla.  Ears:  External ear exam shows no significant lesions or deformities.   Mouth:  Oral mucosa and oropharynx without lesions or exudates.  Lungs:  Coarse rhonchi diffusely bilaterally.  Heart:  Normal rate and regular rhythm. S1 and S2 normal without gallop, murmur, click, rub or other extra sounds. Pulses:  Radial 2+  Neurologic:  alert & oriented X3.   Skin:  no rashes.    Cervical Nodes:  No lymphadenopathy noted Psych:  Cognition and judgment appear intact. Alert and cooperative with normal attention span and concentration. No apparent delusions, illusions, hallucinations   Impression & Recommendations:  Problem # 1:  CELLULITIS, FOOT (ICD-682.7) Assessment New  Clearly has celluilitsi but odd because Levaquin covers strep and staph, except for MRSA. Will get blood cultures and will start doxyccline to cover for MRSA. Will set up for MRI of the foot.  Fu on Monday to make sure getting bettter. If not better hen please let me know.  Her updated medication list for this problem includes:    Levaquin 500 Mg Tabs (Levofloxacin) ..... One by mouth once daily    Doxycycline Hyclate 100 Mg Caps (Doxycycline hyclate) .Marland Kitchen... Take 1 tablet by mouth two times a day for 10 days  Her updated medication list for this problem includes:    Levaquin 500 Mg Tabs (Levofloxacin) ..... One by mouth once daily  Orders: T-CBC w/Diff [phone]) T-Blood Culture [phone]) T-Comprehensive Metabolic Panel [phone]) T-*Unlisted MRI Procedure (57846)  Problem # 2:  PNEUMONIA (ICD-486) Assessment: Unchanged Gave NEB treatment in the office. Stil with diffuse rhonchi. INcrease neb tx to q4 hours.  Fu on monday. Go to ED if gets worse. She is already on prednisone. Will get blood cultures.  Her updated medication list for this problem includes:    Levaquin 500 Mg Tabs (Levofloxacin) ..... One by mouth once daily    Doxycycline Hyclate 100 Mg Caps (Doxycycline hyclate) .Marland Kitchen... Take 1 tablet by mouth two times a day for 10 days  Her updated medication list for this problem includes:    Levaquin 500 Mg Tabs (Levofloxacin) ..... One by mouth once daily  Orders: Albuterol Sulfate Sol 1mg  unit dose (N6295) Atrovent 1mg  (Neb) (M8413) Nebulizer Tx (24401)  Problem # 3:  ASTHMA (ICD-493.90) Given NEB, not much improvement. Finish steroid and increase neb to Q 4 hours. Fu on  Monday.  Her updated medication list for this problem includes:    Proair Hfa 108 (90 Base) Mcg/act Aers (Albuterol sulfate) .Marland Kitchen... As directed    Albuterol Sulfate (5 Mg/ml) 0.5% Nebu (Albuterol sulfate) .Marland Kitchen... As directed    Proventil Hfa 108 (90 Base) Mcg/act Aers (Albuterol sulfate) .Marland Kitchen... As directed    Prednisone 10 Mg Tabs (Prednisone) .Marland Kitchen... 2 po bid for 3 days, then 1 bid for 2 days, then 1 daily for 2 days.  take pc  Complete Medication List: 1)  Xanax 1 Mg Tabs (Alprazolam) .Marland Kitchen.. 1 tab by mouth two times a day to three times a day as needed 2)  Proair Hfa 108 (90 Base) Mcg/act Aers (Albuterol sulfate) .... As directed 3)  Albuterol Sulfate (5 Mg/ml) 0.5% Nebu (Albuterol sulfate) .... As directed 4)  Proventil Hfa 108 (90 Base) Mcg/act Aers (Albuterol sulfate) .... As directed 5)  Cymbalta 20 Mg Cpep (Duloxetine hcl) .Marland Kitchen.. 1 tab by  mouth once daily 6)  Ambien 10 Mg Tabs (Zolpidem tartrate) .Marland Kitchen.. 1 by mouth hs as needed insomnia 7)  Prednisone 10 Mg Tabs (Prednisone) .... 2 po bid for 3 days, then 1 bid for 2 days, then 1 daily for 2 days.  take pc 8)  Benzonatate 200 Mg Caps (Benzonatate) .... One by mouth hs as needed cough 9)  Levaquin 500 Mg Tabs (Levofloxacin) .... One by mouth once daily 10)  Doxycycline Hyclate 100 Mg Caps (Doxycycline hyclate) .... Take 1 tablet by mouth two times a day for 10 days  Other Orders: Psychiatric Referral (Psych)  Patient Instructions: 1)  Finish levaquin 2)  Start doxycycline 3)  Do your NEB treatments every 4 hours. 4)  will call you with the MRI appt and time.  Prescriptions: DOXYCYCLINE HYCLATE 100 MG CAPS (DOXYCYCLINE HYCLATE) Take 1 tablet by mouth two times a day for 10 days  #20 x 0   Entered and Authorized by:   Nani Gasser MD   Signed by:   Nani Gasser MD on 12/23/2008   Method used:   Electronically to        [facility] [phone]* (retail)       [address] [place], [place]  [address]       Ph: [phone] or  [phone]       Fax: [phone]   RxID:   [id number]    Medication Administration  Medication # 1:    Medication: Albuterol Sulfate Sol 1mg  unit dose    Diagnosis: PNEUMONIA (ICD-486)    Dose: 2.5mg     Route: inhaled    Exp Date: 11/06/2009    Lot #: [id number]    Mfr: Nphron    Patient tolerated medication without complications    Given by: Kathlene November (December 23, 2008 11:59 AM)  Medication # 2:    Medication: Atrovent 1mg  (Neb)    Diagnosis: PNEUMONIA (ICD-486)    Dose: 0.5mg     Route: inhaled    Exp Date: 01/06/2010    Lot #: [id number]    Mfr: Nephron    Patient tolerated medication without complications    Given by: Kathlene November (December 23, 2008 12:00 PM)  Orders Added: 1)  Albuterol Sulfate Sol 1mg  unit dose [J7613] 2)  Atrovent 1mg  (Neb) [P3295] 3)  Nebulizer Tx [94640] 4)  T-CBC w/Diff [[id number]] 5)  T-Blood Culture [[id number]] 6)  T-Comprehensive Metabolic Panel [[id number]] 7)  T-*Unlisted MRI Procedure [01601] 8)  Psychiatric Referral [Psych] 9)  New Patient Level IV [09323]

## 2010-05-08 NOTE — Letter (Signed)
Summary: Dismissal Forms  Dismissal Forms   Imported By: Maryln Gottron 01/19/2010 13:35:51  _____________________________________________________________________  External Attachment:    Type:   Image     Comment:   External Document

## 2010-05-08 NOTE — Letter (Signed)
Summary: Patient No Show/[facility]  Patient No Show/[facility]   Imported By: Lanelle Bal 08/11/2009 12:34:38  _____________________________________________________________________  External Attachment:    Type:   Image     Comment:   External Document

## 2010-05-08 NOTE — Letter (Signed)
Summary: [facility]  [facility]   Imported By: Lanelle Bal 08/28/2009 13:18:48  _____________________________________________________________________  External Attachment:    Type:   Image     Comment:   External Document

## 2010-05-08 NOTE — Progress Notes (Signed)
Summary: not feeling good  Phone Note Call from Patient Call back at Home Phone [phone]   Caller: Patient Call For: Nani Gasser MD Summary of Call: Pt calls not feeling good at all. Legs hurting bilaterally and arms, dizziness. On Cymbalta for Fibromyalgia and ? if this is it. Also said you would refill her Xanax Initial call taken by: Kathlene November,  December 28, 2008 1:19 PM  Follow-up for Phone Call        Fu in 2 weeks. Also needs notes from prior PCP for how much xanax using. The refill request we got was for whatDr. Cathren Harsh had given her.  Follow-up by: Nani Gasser MD,  December 28, 2008 1:27 PM  Additional Follow-up for Phone Call Additional follow up Details #1::        Pt notified of MD instructions. KJ LPN Additional Follow-up by: Kathlene November,  December 28, 2008 2:31 PM     Appended Document: not feeling good Prescriptions: XANAX 1 MG TABS (ALPRAZOLAM) 1 tab by mouth two times a day to three times a day as needed  #60 x 0   Entered and Authorized by:   Nani Gasser MD   Signed by:   Nani Gasser MD on 12/29/2008   Method used:   Printed then faxed to ...       [facility]  [place] [phone][facility] (retail)       [address] [place], [place]  [address]       Ph: [phone] or [phone]       Fax: [phone]   RxID:   [id number]

## 2010-05-08 NOTE — Assessment & Plan Note (Signed)
Summary: Sinusitis, asthma flare   Vital Signs:  Patient profile:   45 year old female Weight:      195.50 pounds BMI:     32.65 O2 Sat:      96 % on Room air Temp:     98.4 degrees F oral Pulse rate:   81 / minute Pulse rhythm:   regular Resp:     18 per minute BP sitting:   115 / 80  (left arm) Cuff size:   large  Vitals Entered By: Glendell Docker CMA (June 06, 2009 10:47 AM)  O2 Flow:  Room air  Serial Vital Signs/Assessments:                                PEF    PreRx  PostRx Time      O2 Sat  O2 Type     L/min  L/min  L/min   By 11:06 AM                             250            Darlene Knight CMA 11:40 AM                                    300     Glendell Docker CMA  Comments: 11:40 AM patient is in the yellow By: Glendell Docker CMA   CC: Headache Comments onset Thursday of last week, c/o fever , 101 releived with Tylenol, headache, asthma aggravated, achey   Primary Care Provider:  Nani Gasser MD  CC:  Headache.  History of Present Illness: onset Thursday of last week( 6 dayss), c/o fever , 101 releived with Tylenol, headache, asthma aggravated, achey all over. Head feel slike a ballon.  Feel some congested. ST is better now. No GI sxs. Took some mucinex for cough. cough is worse at night when lays flat. Feels she is getting worse.    Allergies (verified): No Known Drug Allergies  Past History:  Past Medical History: Last updated: 12/23/2008 Anxiety Asthma Fibromyalgia Hx of coumadin therpay for DVT.   Physical Exam  General:  Well-developed,well-nourished,in no acute distress; alert,appropriate and cooperative throughout examination Head:  Normocephalic and atraumatic without obvious abnormalities. No apparent alopecia or balding. Eyes:  No corneal or conjunctival inflammation noted. EOMI. Perrla.  Ears:  External ear exam shows no significant lesions or deformities.  Otoscopic examination reveals clear canals, tympanic membranes are  intact bilaterally without bulging, retraction, inflammation or discharge. Hearing is grossly normal bilaterally. Nose:  External nasal examination shows no deformity or inflammation. Nasal mucosa are pink and moist without lesions or exudates. Mouth:  Oral mucosa and oropharynx without lesions or exudates.  Teeth in good repair. Neck:  No deformities, masses, or tenderness noted. Lungs:  Normal respiratory effort, chest expands symmetrically.Diffuse expiratory wheeze.  no improvment post NEB.   Heart:  Normal rate and regular rhythm. S1 and S2 normal without gallop, murmur, click, rub or other extra sounds. Pulses:  Radial 2+  Neurologic:  alert & oriented X3.   Skin:  no rashes.   Cervical Nodes:  No lymphadenopathy noted Psych:  Cognition and judgment appear intact. Alert and cooperative with normal attention span and concentration. No apparent delusions, illusions, hallucinations   Impression & Recommendations:  Problem # 1:  SINUSITIS - ACUTE-NOS (ICD-461.9) Assessment New  Her updated medication list for this problem includes:    Amoxicillin 500 Mg Cap (Amoxicillin) .Marland Kitchen... Take 1 capsule by mouth three times a day x 10 days  Instructed on treatment. Call if symptoms persist or worsen.   Problem # 2:  ASTHMA (ICD-493.90) Assessment: Deteriorated Given depomedrol injection today and rx sen for steroids for 2 weeks. She did improve post NEB but her exam really didn't change.  Aslo wrote out NEB schedule as well.  Continue advair. Fu in 2 weeks to recheck.  Her updated medication list for this problem includes:    Proair Hfa 108 (90 Base) Mcg/act Aers (Albuterol sulfate) .Marland Kitchen... As directed    Albuterol Sulfate (5 Mg/ml) 0.5% Nebu (Albuterol sulfate) .Marland Kitchen... As directed    Proventil Hfa 108 (90 Base) Mcg/act Aers (Albuterol sulfate) .Marland Kitchen... As directed    Advair Diskus 500-50 Mcg/dose Misc (Fluticasone-salmeterol) .Marland Kitchen... Take one (1) inhalation twice a day    Prednisone 10 Mg Tabs  (Prednisone) .Marland KitchenMarland KitchenMarland KitchenMarland Kitchen 4 tabs by mouth once a day for 5 days, then 2 tabs daily for 5 days, then one a day for 5 days, then stop  Orders: Nebulizer Tx (04540) Albuterol Sulfate Sol 1mg  unit dose (J8119) Depo- Medrol 80mg  (J1040) Admin of Therapeutic Inj  intramuscular or subcutaneous (14782) Atrovent 1mg  (Neb) (J7644) Peak Flow Rate (94150) Peak Flow Meter (N5621)  Problem # 3:  MUSCLE PAIN (ICD-729.1) Trial of requip. Pt agrees. WE had discussed previously. Pt to keep her rheum appt and let me know if requip is helping.   Complete Medication List: 1)  Xanax 1 Mg Tabs (Alprazolam) .Marland Kitchen.. 1 tab by mouth two times a day to three times a day as needed 2)  Proair Hfa 108 (90 Base) Mcg/act Aers (Albuterol sulfate) .... As directed 3)  Albuterol Sulfate (5 Mg/ml) 0.5% Nebu (Albuterol sulfate) .... As directed 4)  Proventil Hfa 108 (90 Base) Mcg/act Aers (Albuterol sulfate) .... As directed 5)  Ambien 10 Mg Tabs (Zolpidem tartrate) .Marland Kitchen.. 1 by mouth hs as needed insomnia 6)  Advair Diskus 500-50 Mcg/dose Misc (Fluticasone-salmeterol) .... Take one (1) inhalation twice a day 7)  Abilify 10 Mg Tabs (Aripiprazole) .... Take 1 tablet by mouth once a day 8)  Requip 0.25 Mg Tabs (Ropinirole hcl) .Marland Kitchen.. 1-2 tabs at in the evening. 9)  Prednisone 10 Mg Tabs (Prednisone) .... 4 tabs by mouth once a day for 5 days, then 2 tabs daily for 5 days, then one a day for 5 days, then stop 10)  Amoxicillin 500 Mg Cap (Amoxicillin) .... Take 1 capsule by mouth three times a day x 10 days  Patient Instructions: 1)  Start albuterol NEBs every 4 hours for a couple of days and then slowly wean as feeling better. 2)  Start the steroid and antibiotic as prescribed. 3)  Followup in 2 weeks to recheck asthma.  4)  Continue Advil.  Prescriptions: AMBIEN 10 MG TABS (ZOLPIDEM TARTRATE) 1 by mouth hs as needed insomnia  #30 x 0   Entered and Authorized by:   Nani Gasser MD   Signed by:   Nani Gasser MD on  06/06/2009   Method used:   Printed then faxed to ...       [facility]  [address] [phone]* (retail)       [address] [place], [place]  [address]       Ph: [phone]  or [phone]       Fax: [phone]   RxID:   [id number] XANAX 1 MG TABS (ALPRAZOLAM) 1 tab by mouth two times a day to three times a day as needed  #60 x 0   Entered and Authorized by:   Nani Gasser MD   Signed by:   Nani Gasser MD on 06/06/2009   Method used:   Printed then faxed to ...       [facility]  [place] [phone]* (retail)       [address] [place], [place]  [address]       Ph: [phone] or [phone]       Fax: [phone]   RxID:   [id number] AMOXICILLIN 500 MG CAP (AMOXICILLIN) Take 1 capsule by mouth three times a day X 10 days  #30 x 0   Entered and Authorized by:   Nani Gasser MD   Signed by:   Nani Gasser MD on 06/06/2009   Method used:   Electronically to        [facility] [phone]* (retail)       [address] [place], [place]  [address]       Ph: [phone] or [phone]       Fax: [phone]   RxID:   [id number] PREDNISONE 10 MG TABS (PREDNISONE) 4 tabs by mouth once a day for 5 days, then 2 tabs daily for 5 days, then one a day for 5 days, then stop  #35 x 0   Entered and Authorized by:   Nani Gasser MD   Signed by:   Nani Gasser MD on 06/06/2009   Method used:   Electronically to        [facility] [phone]* (retail)       [address] [place], [place]  [address]       Ph: [phone] or [phone]       Fax: [phone]   RxID:   [id number] REQUIP 0.25 MG TABS (ROPINIROLE HCL) 1-2 tabs at in the evening.  #30 x 0   Entered and Authorized by:   Nani Gasser MD   Signed by:   Nani Gasser MD on 06/06/2009   Method used:   Electronically to        [facility] [phone]* (retail)       [address] [place], [place]  [address]       Ph: [phone] or [phone]       Fax: [phone]    RxID:   [id number]   Current Allergies (reviewed today): No known allergies    Medication Administration  Injection # 1:    Medication: Depo- Medrol 80mg     Diagnosis: ASTHMA (ICD-493.90)    Route: IM    Site: RUOQ gluteus    Exp Date: 12/06/2009    Lot #: [id number]    Mfr: Pharmacia    Comments: 60mg  given IM    Patient tolerated injection without complications    Given by: Glendell Docker CMA (June 06, 2009 11:56 AM)  Medication # 1:    Medication: Albuterol Sulfate Sol 1mg  unit dose    Diagnosis: ASTHMA (ICD-493.90)    Dose: 5MG     Route: inhaled    Exp Date: 01/06/2011    Lot #: [id number]  Mfr: NEPHRON    Patient tolerated medication without complications    Given by: Glendell Docker CMA (June 06, 2009 11:26 AM)  Orders Added: 1)  Nebulizer Tx [id number] 2)  Albuterol Sulfate Sol 1mg  unit dose [U0454] 3)  Depo- Medrol 80mg  [J1040] 4)  Admin of Therapeutic Inj  intramuscular or subcutaneous [96372] 5)  Est. Patient Level IV [09811] 6)  Atrovent 1mg  (Neb) [B1478] 7)  Peak Flow Rate [94150] 8)  Peak Flow Meter [G9562]

## 2010-05-08 NOTE — Progress Notes (Signed)
Summary: ?? Psych apt  Phone Note Call from Patient   Caller: Patient Summary of Call: Pt called questioning psych apt. I couldn't find an apt time and date, however, I did find a note from 05/22/09 w/ a phone number for physch in [place]. I gave Pt the number and advised her to call that office to get info. Pt agreed.  Initial call taken by: Payton Spark CMA,  May 29, 2009 8:40 AM

## 2010-05-08 NOTE — Progress Notes (Signed)
Summary: Muscles twitching and aching  Phone Note Call from Patient Call back at Home Phone [phone]   Caller: Patient Call For: Nani Gasser MD Summary of Call: pt calls and states that she knows she does not have RLS- its her muscles twitching in legs arms back, etc. ? if its fibromyalgia and wants you to look into this Initial call taken by: Kathlene November,  May 09, 2009 1:50 PM  Follow-up for Phone Call        WE have talking about this over and over. Needs to keep appt with specialist. Follow-up by: Nani Gasser MD,  May 09, 2009 1:55 PM  Additional Follow-up for Phone Call Additional follow up Details #1::        Tried to call pt- no answer and no machine 05/11/2009 @ 8:29am- Tried to all pt- no answer and no machine Additional Follow-up by: Kathlene November,  May 10, 2009 8:23 AM

## 2010-05-08 NOTE — Progress Notes (Signed)
Summary: Pt no showed for rhuematology  Note recieved today from Dr Kerrie Buffalo office that she no showed for her rhueum appt. March  8, 20113:05 PM Metheney MD, Santina Evans

## 2010-05-08 NOTE — Progress Notes (Signed)
Summary: Referral to Rheumatologists  Phone Note Outgoing Call   Call placed by: Kathlene November,  May 23, 2009 8:48 AM Call placed to: Dr. Gay/Rheumatologist [place] Summary of Call: Spoke with Maralyn Sago at Dr. Hardie Shackleton office in [place] in regards to getting pt referred there. Faxed records and labs and MD will review and Huntley Dec will call pt and our office to let uas know if an appointmment has been made or if MD wishes to not see pt. Initial call taken by: Kathlene November,  May 23, 2009 8:49 AM

## 2010-05-08 NOTE — Assessment & Plan Note (Signed)
Summary: ASTHMA/TM   Vital Signs:  Patient Profile:   45 Years Old Female CC:      Cold & URI symptoms Height:     65 inches Weight:      191 pounds O2 Sat:      98 % O2 treatment:    Room Air Temp:     97.7 degrees F oral Pulse rate:   66 / minute Pulse rhythm:   regular Resp:     15 per minute BP sitting:   116 / 75  (right arm) Cuff size:   regular  Vitals Entered By: Areta Haber CMA (July 29, 2009 2:57 PM)                  Current Allergies: No known allergies History of Present Illness Chief Complaint: Cold & URI symptoms  REVIEW OF SYSTEMS Constitutional Symptoms      Denies fever, chills, night sweats, weight loss, weight gain, and fatigue.  Eyes       Denies change in vision, eye pain, eye discharge, glasses, contact lenses, and eye surgery. Ear/Nose/Throat/Mouth       Complains of sinus problems.      Denies hearing loss/aids, change in hearing, ear pain, ear discharge, dizziness, frequent runny nose, frequent nose bleeds, sore throat, hoarseness, and tooth pain or bleeding.  Respiratory       Complains of wheezing, shortness of breath, and asthma.      Denies dry cough, productive cough, bronchitis, and emphysema/COPD.  Cardiovascular       Denies murmurs, chest pain, and tires easily with exhertion.    Gastrointestinal       Denies stomach pain, nausea/vomiting, diarrhea, constipation, blood in bowel movements, and indigestion. Genitourniary       Denies painful urination, kidney stones, and loss of urinary control. Neurological       Denies paralysis, seizures, and fainting/blackouts. Musculoskeletal       Denies muscle pain, joint pain, joint stiffness, decreased range of motion, redness, swelling, muscle weakness, and gout.  Skin       Denies bruising, unusual mles/lumps or sores, and hair/skin or nail changes.  Psych       Denies mood changes, temper/anger issues, anxiety/stress, speech problems, depression, and sleep problems. Other  Comments: Pt stated asthma induced by leg pain she has been having for a while. Pt stated that she has a referral to see a neurologist next week but needs something for the pain to get her to next week.  Pt stated she no longer goes to Dr. Linford Arnold.    Plan Planning Comments:   Patient not seen . When she was informed that I would be glad to see her for her asthma but not renew pain medication for a chronic medical problems that ws followed by her PCP patient left. Especial became concern that patient stressed the need tobe seen ahead of otherb patients here because of her breathing difficulty.   The patient and/or caregiver has been counseled thoroughly with regard to medications prescribed including dosage, schedule, interactions, rationale for use, and possible side effects and they verbalize understanding.  Diagnoses and expected course of recovery discussed and will return if not improved as expected or if the condition worsens. Patient and/or caregiver verbalized understanding.

## 2010-08-08 DIAGNOSIS — G8929 Other chronic pain: Secondary | ICD-10-CM | POA: Insufficient documentation

## 2011-02-04 DIAGNOSIS — Z720 Tobacco use: Secondary | ICD-10-CM | POA: Insufficient documentation

## 2011-02-04 DIAGNOSIS — Z8719 Personal history of other diseases of the digestive system: Secondary | ICD-10-CM | POA: Insufficient documentation

## 2011-02-07 DIAGNOSIS — N8110 Cystocele, unspecified: Secondary | ICD-10-CM | POA: Insufficient documentation

## 2011-04-15 DIAGNOSIS — N393 Stress incontinence (female) (male): Secondary | ICD-10-CM | POA: Insufficient documentation

## 2011-09-15 DIAGNOSIS — Z8709 Personal history of other diseases of the respiratory system: Secondary | ICD-10-CM | POA: Insufficient documentation

## 2011-10-24 DIAGNOSIS — F314 Bipolar disorder, current episode depressed, severe, without psychotic features: Secondary | ICD-10-CM | POA: Insufficient documentation

## 2011-10-24 DIAGNOSIS — F3131 Bipolar disorder, current episode depressed, mild: Secondary | ICD-10-CM | POA: Insufficient documentation

## 2012-05-11 ENCOUNTER — Emergency Department
Admission: EM | Admit: 2012-05-11 | Discharge: 2012-05-11 | Disposition: A | Discharge status: Home / Self Care | Payer: Medicaid Other | Source: Home / Self Care | Attending: Family Medicine | Admitting: Family Medicine

## 2012-05-11 ENCOUNTER — Encounter: Payer: Self-pay | Admitting: *Deleted

## 2012-05-11 DIAGNOSIS — M545 Low back pain, unspecified: Secondary | ICD-10-CM

## 2012-05-11 DIAGNOSIS — S7000XA Contusion of unspecified hip, initial encounter: Secondary | ICD-10-CM

## 2012-05-11 DIAGNOSIS — S7001XA Contusion of right hip, initial encounter: Secondary | ICD-10-CM

## 2012-05-11 HISTORY — DX: Other chronic pain: G89.29

## 2012-05-11 HISTORY — DX: Unspecified asthma, uncomplicated: J45.909

## 2012-05-11 HISTORY — DX: Dorsalgia, unspecified: M54.9

## 2012-05-11 MED ORDER — METHYLPREDNISOLONE ACETATE 80 MG/ML IJ SUSP
80.0000 mg | Freq: Once | INTRAMUSCULAR | Status: AC
  Administered 2012-05-11: 80 mg via INTRAMUSCULAR

## 2012-05-11 MED ORDER — MELOXICAM 15 MG PO TABS: 15.0000 mg | ORAL_TABLET | Freq: Every day | ORAL | Status: DC

## 2012-05-11 NOTE — ED Provider Notes (Signed)
History     CSN: [id number]  Arrival date & time 05/11/12  1615   First MD Initiated Contact with Patient 05/11/12 1704      Chief Complaint  Patient presents with  . Back Pain      HPI Comments: Patient states that she slipped on some ice four days ago, falling down several stairs landing on her back and right side.  She had minimal pain initially, but developed increased stiffness/soreness the next day.  Her pain radiates into the right hip area.  No bowel bladder dysfunction.  No saddle numbness.  She has a history of L4-L5 disc surgery one year ago.  She is presently followed by a local pain clinic for chronic pain. Her meds include methadone 10mg  and oxycodone/acetaminophen 10-325.  Patient is a 47 y.o. female presenting with back pain. The history is provided by the patient.  Back Pain  This is a recurrent problem. Episode onset: 3 days ago. The problem occurs constantly. The problem has been gradually worsening. The pain is associated with falling. The pain is present in the lumbar spine (right hip). The quality of the pain is described as aching. Radiates to: right hip. The pain is at a severity of 7/10. The pain is moderate. Exacerbated by: walking. The pain is the same all the time. Associated symptoms include leg pain. Pertinent negatives include no fever, no numbness, no abdominal pain, no abdominal swelling, no bowel incontinence, no perianal numbness, no bladder incontinence, no dysuria, no pelvic pain, no paresthesias, no paresis, no tingling and no weakness. She has tried nothing for the symptoms. Risk factors: History of lumbar disc surgery.    Past Medical History  Diagnosis Date  . Asthma   . Chronic back pain     Past Surgical History  Procedure Date  . Back surgery   . Abdominal hysterectomy     History reviewed. No pertinent family history.  History  Substance Use Topics  . Smoking status: Never Smoker   . Smokeless tobacco: Not on file  . Alcohol Use: No     OB History    Grav Para Term Preterm Abortions TAB SAB Ect Mult Living                  Review of Systems  Constitutional: Negative for fever.  Gastrointestinal: Negative for abdominal pain and bowel incontinence.  Genitourinary: Negative for bladder incontinence, dysuria and pelvic pain.  Musculoskeletal: Positive for back pain.  Neurological: Negative for tingling, weakness, numbness and paresthesias.    Allergies  Review of patient's allergies indicates no known allergies.  Home Medications   Current Outpatient Rx  Name  Route  Sig  Dispense  Refill  . IPRATROPIUM-ALBUTEROL 0.5-2.5 (3) MG/3ML IN SOLN   Nebulization   Take 3 mLs by nebulization.         . METHADONE HCL 10 MG PO TABS   Oral   Take 10 mg by mouth every 8 (eight) hours.         . MELOXICAM 15 MG PO TABS   Oral   Take 1 tablet (15 mg total) by mouth daily. Take with food   10 tablet   0     BP 127/82  Pulse 80  Temp 98.8 F (37.1 C) (Oral)  Resp 16  Ht 5\' 6"  (1.676 m)  Wt 171 lb (77.565 kg)  BMI 27.60 kg/m2  SpO2 98%  Physical Exam Nursing notes and Vital Signs reviewed. Appearance:  Patient appears stated  age, and in no acute distress Eyes:  Pupils are equal, round, and reactive to light and accomodation.  Extraocular movement is intact.  Conjunctivae are not inflamed  Pharynx:  Normal Neck:  Supple.  No adenopathy Lungs:  Clear to auscultation.  Breath sounds are equal.  Heart:  Regular rate and rhythm without murmurs, rubs, or gallops.  Abdomen:  Nontender without masses or hepatosplenomegaly.  Bowel sounds are present.  No CVA or flank tenderness.  Extremities:  No edema.   There is tenderness over the right greater trochanter but right hip has full range of motion. Skin:  No rash present. Back:   Can heel/toe walk and squat without difficulty.  Decreased forward flexion.  Tenderness in the midline and bilateral paraspinous muscles from L3 to Sacral area.  There is a well-healed  midline surgical scar over lower lumbar spine.  Straight leg raising test is negative.  Sitting knee extension test is negative.  Strength and sensation in the lower extremities is normal.  Achilles reflexes are normal.  Left patellar reflex normal.  Right patellar reflex difficult to elicit.    ED Course  Procedures        1. Low back pain   2. Contusion of right hip       MDM  DepoMedrol 80mg  IM. Begin Mobic 15mg  one daily with food. Apply ice pack 3 times daily. Followup with pain management specialist        Lattie Haw, MD 05/13/12 1321

## 2012-05-11 NOTE — ED Notes (Signed)
Pt c/o low back pain, post fall x 3 days ago. She reports taking no meds for her back pain.

## 2012-06-02 DIAGNOSIS — S3421XA Injury of nerve root of lumbar spine, initial encounter: Secondary | ICD-10-CM | POA: Insufficient documentation

## 2012-08-06 DIAGNOSIS — M5137 Other intervertebral disc degeneration, lumbosacral region: Secondary | ICD-10-CM | POA: Insufficient documentation

## 2012-08-06 DIAGNOSIS — M51379 Other intervertebral disc degeneration, lumbosacral region without mention of lumbar back pain or lower extremity pain: Secondary | ICD-10-CM | POA: Insufficient documentation

## 2012-08-06 DIAGNOSIS — G894 Chronic pain syndrome: Secondary | ICD-10-CM | POA: Insufficient documentation

## 2012-10-09 ENCOUNTER — Emergency Department (HOSPITAL_COMMUNITY)
Admission: EM | Admit: 2012-10-09 | Discharge: 2012-10-09 | Disposition: A | Discharge status: Home / Self Care | Payer: Medicaid Other | Attending: Emergency Medicine | Admitting: Emergency Medicine

## 2012-10-09 ENCOUNTER — Encounter (HOSPITAL_COMMUNITY): Payer: Self-pay | Admitting: Emergency Medicine

## 2012-10-09 ENCOUNTER — Other Ambulatory Visit: Payer: Self-pay

## 2012-10-09 ENCOUNTER — Emergency Department (HOSPITAL_COMMUNITY): Payer: Medicaid Other

## 2012-10-09 DIAGNOSIS — Z79899 Other long term (current) drug therapy: Secondary | ICD-10-CM | POA: Insufficient documentation

## 2012-10-09 DIAGNOSIS — J45901 Unspecified asthma with (acute) exacerbation: Secondary | ICD-10-CM | POA: Insufficient documentation

## 2012-10-09 DIAGNOSIS — G8929 Other chronic pain: Secondary | ICD-10-CM | POA: Insufficient documentation

## 2012-10-09 DIAGNOSIS — M549 Dorsalgia, unspecified: Secondary | ICD-10-CM | POA: Insufficient documentation

## 2012-10-09 DIAGNOSIS — R059 Cough, unspecified: Secondary | ICD-10-CM | POA: Insufficient documentation

## 2012-10-09 DIAGNOSIS — R05 Cough: Secondary | ICD-10-CM | POA: Insufficient documentation

## 2012-10-09 DIAGNOSIS — F17200 Nicotine dependence, unspecified, uncomplicated: Secondary | ICD-10-CM | POA: Insufficient documentation

## 2012-10-09 LAB — CBC WITH DIFFERENTIAL/PLATELET
Basophils Absolute: 0 10*3/uL (ref 0.0–0.1)
Basophils Relative: 0 % (ref 0–1)
Eosinophils Absolute: 0.3 10*3/uL (ref 0.0–0.7)
Eosinophils Relative: 3 % (ref 0–5)
HCT: 46.2 % — ABNORMAL HIGH (ref 36.0–46.0)
Hemoglobin: 16 g/dL — ABNORMAL HIGH (ref 12.0–15.0)
Lymphocytes Relative: 38 % (ref 12–46)
Lymphs Abs: 4.4 10*3/uL — ABNORMAL HIGH (ref 0.7–4.0)
MCH: 30.4 pg (ref 26.0–34.0)
MCHC: 34.6 g/dL (ref 30.0–36.0)
MCV: 87.8 fL (ref 78.0–100.0)
Monocytes Absolute: 1.2 10*3/uL — ABNORMAL HIGH (ref 0.1–1.0)
Monocytes Relative: 10 % (ref 3–12)
Neutro Abs: 5.7 10*3/uL (ref 1.7–7.7)
Neutrophils Relative %: 49 % (ref 43–77)
Platelets: 283 10*3/uL (ref 150–400)
RBC: 5.26 MIL/uL — ABNORMAL HIGH (ref 3.87–5.11)
RDW: 11.9 % (ref 11.5–15.5)
WBC: 11.6 10*3/uL — ABNORMAL HIGH (ref 4.0–10.5)

## 2012-10-09 LAB — BASIC METABOLIC PANEL
BUN: 21 mg/dL (ref 6–23)
CO2: 28 mEq/L (ref 19–32)
Calcium: 9.8 mg/dL (ref 8.4–10.5)
Chloride: 103 mEq/L (ref 96–112)
Creatinine, Ser: 1.07 mg/dL (ref 0.50–1.10)
GFR calc Af Amer: 69 mL/min — ABNORMAL LOW (ref >90)
GFR calc non Af Amer: 60 mL/min — ABNORMAL LOW (ref >90)
Glucose, Bld: 101 mg/dL — ABNORMAL HIGH (ref 70–99)
Potassium: 3.7 mEq/L (ref 3.5–5.1)
Sodium: 142 mEq/L (ref 135–145)

## 2012-10-09 MED ORDER — PREDNISONE 50 MG PO TABS: ORAL_TABLET | ORAL | Status: DC

## 2012-10-09 MED ORDER — MORPHINE SULFATE 4 MG/ML IJ SOLN
4.0000 mg | Freq: Once | INTRAMUSCULAR | Status: AC
  Administered 2012-10-09: 4 mg via INTRAVENOUS
  Filled 2012-10-09: qty 1

## 2012-10-09 MED ORDER — ALBUTEROL SULFATE (5 MG/ML) 0.5% IN NEBU
INHALATION_SOLUTION | RESPIRATORY_TRACT | Status: AC
  Administered 2012-10-09: 5 mg via RESPIRATORY_TRACT
  Filled 2012-10-09: qty 1

## 2012-10-09 MED ORDER — METHYLPREDNISOLONE SODIUM SUCC 125 MG IJ SOLR
125.0000 mg | INTRAMUSCULAR | Status: AC
  Administered 2012-10-09: 125 mg via INTRAVENOUS
  Filled 2012-10-09: qty 2

## 2012-10-09 MED ORDER — ALBUTEROL SULFATE (5 MG/ML) 0.5% IN NEBU
5.0000 mg | INHALATION_SOLUTION | Freq: Once | RESPIRATORY_TRACT | Status: AC
  Administered 2012-10-09: 5 mg via RESPIRATORY_TRACT

## 2012-10-09 MED ORDER — IPRATROPIUM BROMIDE 0.02 % IN SOLN
RESPIRATORY_TRACT | Status: AC
  Administered 2012-10-09: 0.5 mg via RESPIRATORY_TRACT
  Filled 2012-10-09: qty 5

## 2012-10-09 MED ORDER — IPRATROPIUM BROMIDE 0.02 % IN SOLN: 0.5000 mg | Freq: Once | RESPIRATORY_TRACT | Status: AC

## 2012-10-09 MED ORDER — IPRATROPIUM BROMIDE 0.02 % IN SOLN
RESPIRATORY_TRACT | Status: AC
  Filled 2012-10-09: qty 2.5

## 2012-10-09 MED ORDER — ALBUTEROL SULFATE (5 MG/ML) 0.5% IN NEBU
10.0000 mg | INHALATION_SOLUTION | Freq: Once | RESPIRATORY_TRACT | Status: AC
  Administered 2012-10-09: 10 mg via RESPIRATORY_TRACT
  Filled 2012-10-09: qty 1

## 2012-10-09 MED ORDER — ALBUTEROL SULFATE (5 MG/ML) 0.5% IN NEBU
INHALATION_SOLUTION | RESPIRATORY_TRACT | Status: AC
  Filled 2012-10-09: qty 1

## 2012-10-09 MED ORDER — HYDROMORPHONE HCL PF 1 MG/ML IJ SOLN
1.0000 mg | Freq: Once | INTRAMUSCULAR | Status: AC
  Administered 2012-10-09: 1 mg via INTRAVENOUS
  Filled 2012-10-09: qty 1

## 2012-10-09 NOTE — ED Notes (Signed)
Ambulated pt. Pt O2 sats remained above 98% on room air. Pt HR while ambulating 98-105

## 2012-10-09 NOTE — ED Notes (Signed)
Pt was waiting in lobby to be triaged for back pain and began having an asthma attack

## 2012-10-09 NOTE — ED Provider Notes (Signed)
History    CSN: [id number] Arrival date & time 10/09/12  0142  First MD Initiated Contact with Patient 10/09/12 0214    Chief complaint - shortness of breath  Patient is a 47 y.o. female presenting with shortness of breath. The history is provided by the patient.  Shortness of Breath Severity:  Severe Onset quality:  Gradual Timing:  Constant Progression:  Worsening Chronicity:  Recurrent Relieved by:  Nothing Worsened by:  Activity Associated symptoms: cough and wheezing   Associated symptoms: no fever and no vomiting    Pt presents with SOB - she reports she is having an asthma attack with cough/wheeze and chest tightness She also reports back pain - she actually presented to the ED for back pain and while here her asthma worsened For her back pain, she has h/o chronic back pain that worsened recently due to heavy lifting but denies falls and denies new leg weakness    Past Medical History  Diagnosis Date  . Asthma   . Chronic back pain    Past Surgical History  Procedure Laterality Date  . Back surgery    . Abdominal hysterectomy     No family history on file. History  Substance Use Topics  . Smoking status: Never Smoker   . Smokeless tobacco: Not on file  . Alcohol Use: No   OB History   Grav Para Term Preterm Abortions TAB SAB Ect Mult Living                 Review of Systems  Constitutional: Negative for fever.  Respiratory: Positive for cough, shortness of breath and wheezing.   Cardiovascular: Negative for leg swelling.  Gastrointestinal: Negative for vomiting.  Musculoskeletal: Positive for back pain.  Neurological: Negative for weakness.  All other systems reviewed and are negative.    Allergies  Review of patient's allergies indicates no known allergies.  Home Medications   Current Outpatient Rx  Name  Route  Sig  Dispense  Refill  . ipratropium-albuterol (DUONEB) 0.5-2.5 (3) MG/3ML SOLN   Nebulization   Take 3 mLs by nebulization.        . meloxicam (MOBIC) 15 MG tablet   Oral   Take 1 tablet (15 mg total) by mouth daily. Take with food   10 tablet   0   . methadone (DOLOPHINE) 10 MG tablet   Oral   Take 10 mg by mouth every 8 (eight) hours.          BP 130/99  Pulse 110  Temp(Src) 98.3 F (36.8 C) (Oral)  Resp 21  SpO2 100% Physical Exam CONSTITUTIONAL: Well developed/well nourished, anxious appearing HEAD: Normocephalic/atraumatic EYES: EOMI/PERRL ENMT: Mucous membranes moist NECK: supple no meningeal signs SPINE:lumbar spinal tenderness, No bruising/crepitance/stepoffs noted to spine CV: S1/S2 noted, no murmurs/rubs/gallops noted LUNGS: diffuse coarse wheezing noted bilaterally with tachypnea but she is able to speak. She has harsh cough ABDOMEN: soft, nontender, no rebound or guarding GU:no cva tenderness NEURO: Pt is awake/alert, moves all extremitiesx4 EXTREMITIES: pulses normal, full ROM SKIN: warm, color normal PSYCH: no abnormalities of mood noted  ED Course  Procedures  Labs Reviewed  CBC WITH DIFFERENTIAL  BASIC METABOLIC PANEL   4:09 AM For her back pain, this appears acute on chronic, no focal neuro deficits, will treat pain For her asthma, nebs/steroids given.  Will follow closely.  She reports h/o ICU admission in the past Will follow closely  Pt improved in the ED after nebs.  She was  ambulatory without any dyspnea She still has cough but her work of breathing is improved Pt would prefer to be discharged home Will start prednisone at home.    MDM  Nursing notes including past medical history and social history reviewed and considered in documentation xrays reviewed and considered Labs/vital reviewed and considered      Date: 10/09/2012  Rate: 110  Rhythm: sinus tachycardia  QRS Axis: normal  Intervals: normal  ST/T Wave abnormalities: nonspecific ST changes  Conduction Disutrbances:none  Narrative Interpretation:   Old EKG Reviewed: unchanged from 09/2003 except  rate is faster     Joya Gaskins, MD 10/09/12 [phone]

## 2012-10-09 NOTE — ED Notes (Signed)
Dr. Bebe Shaggy aware of pt status, stated he will be back as soon as possible.

## 2012-10-14 ENCOUNTER — Emergency Department (HOSPITAL_COMMUNITY)
Admission: EM | Admit: 2012-10-14 | Discharge: 2012-10-14 | Disposition: A | Discharge status: Home / Self Care | Payer: Medicaid Other | Attending: Emergency Medicine | Admitting: Emergency Medicine

## 2012-10-14 ENCOUNTER — Encounter (HOSPITAL_COMMUNITY): Payer: Self-pay | Admitting: Emergency Medicine

## 2012-10-14 DIAGNOSIS — M545 Low back pain, unspecified: Secondary | ICD-10-CM | POA: Insufficient documentation

## 2012-10-14 DIAGNOSIS — R52 Pain, unspecified: Secondary | ICD-10-CM | POA: Insufficient documentation

## 2012-10-14 DIAGNOSIS — Z79899 Other long term (current) drug therapy: Secondary | ICD-10-CM | POA: Insufficient documentation

## 2012-10-14 DIAGNOSIS — R0789 Other chest pain: Secondary | ICD-10-CM | POA: Insufficient documentation

## 2012-10-14 DIAGNOSIS — J45901 Unspecified asthma with (acute) exacerbation: Secondary | ICD-10-CM

## 2012-10-14 DIAGNOSIS — F17200 Nicotine dependence, unspecified, uncomplicated: Secondary | ICD-10-CM | POA: Insufficient documentation

## 2012-10-14 DIAGNOSIS — G8929 Other chronic pain: Secondary | ICD-10-CM | POA: Insufficient documentation

## 2012-10-14 DIAGNOSIS — R059 Cough, unspecified: Secondary | ICD-10-CM | POA: Insufficient documentation

## 2012-10-14 DIAGNOSIS — R05 Cough: Secondary | ICD-10-CM | POA: Insufficient documentation

## 2012-10-14 MED ORDER — PREDNISONE 20 MG PO TABS
60.0000 mg | ORAL_TABLET | Freq: Once | ORAL | Status: AC
  Administered 2012-10-14: 60 mg via ORAL
  Filled 2012-10-14: qty 3

## 2012-10-14 MED ORDER — PREDNISONE 20 MG PO TABS: 20.0000 mg | ORAL_TABLET | Freq: Two times a day (BID) | ORAL | Status: DC

## 2012-10-14 MED ORDER — HYDROMORPHONE HCL PF 1 MG/ML IJ SOLN
1.0000 mg | Freq: Once | INTRAMUSCULAR | Status: AC
  Administered 2012-10-14: 1 mg via INTRAMUSCULAR
  Filled 2012-10-14: qty 1

## 2012-10-14 MED ORDER — ALBUTEROL SULFATE (5 MG/ML) 0.5% IN NEBU
5.0000 mg | INHALATION_SOLUTION | Freq: Once | RESPIRATORY_TRACT | Status: AC
  Administered 2012-10-14: 5 mg via RESPIRATORY_TRACT
  Filled 2012-10-14: qty 1

## 2012-10-14 NOTE — ED Provider Notes (Signed)
History    CSN: [id number] Arrival date & time 10/14/12  2052  First MD Initiated Contact with Patient 10/14/12 2103     Chief Complaint  Patient presents with  . Asthma   (Consider location/radiation/quality/duration/timing/severity/associated sxs/prior Treatment) HPI History provided by pt.   Pt presents w/ multiple complaints.  Has non-traumatic, acute on chronic, gradually worsening, diffuse, low back pain x 3 weeks.  Radiates down both legs.  No associated fever, extremity weakness/paresthesias, bowel/bladder dysfunction.  Attributes to lifting her father who is in hospice here at [facility].  Also c/o asthma attack.  Has cough, chest tightness, wheezing and SOB.  Attacks frequent despite compliance with her medications.  No relief w/ today w/ albuterol/atrovent neb and rescue inhaler.  Per prior chart, pt seen for same 5d ago.   Past Medical History  Diagnosis Date  . Asthma   . Chronic back pain    Past Surgical History  Procedure Laterality Date  . Back surgery    . Abdominal hysterectomy    . Insertion of mesh     Family History  Problem Relation Age of Onset  . Cancer Mother   . Cancer Father   . Diabetes Other   . Hypertension Other    History  Substance Use Topics  . Smoking status: Current Every Day Smoker -- 0.50 packs/day    Types: Cigarettes  . Smokeless tobacco: Not on file  . Alcohol Use: No   OB History   Grav Para Term Preterm Abortions TAB SAB Ect Mult Living                 Review of Systems  All other systems reviewed and are negative.    Allergies  Compazine and Ketorolac tromethamine  Home Medications   Current Outpatient Rx  Name  Route  Sig  Dispense  Refill  . albuterol (PROVENTIL HFA;VENTOLIN HFA) 108 (90 BASE) MCG/ACT inhaler   Inhalation   Inhale 2 puffs into the lungs every 6 (six) hours as needed for wheezing.         Marland Kitchen ALPRAZolam (XANAX) 1 MG tablet   Oral   Take 1 mg by mouth 2 (two) times daily as needed for sleep or  anxiety.         Marland Kitchen ipratropium-albuterol (DUONEB) 0.5-2.5 (3) MG/3ML SOLN   Nebulization   Take 3 mLs by nebulization every 6 (six) hours as needed. For shortness of breath         . predniSONE (DELTASONE) 50 MG tablet      One tablet PO daily for 4 days   4 tablet   0   . zolpidem (AMBIEN) 5 MG tablet   Oral   Take 5 mg by mouth at bedtime as needed for sleep.          BP 135/79  Pulse 92  Temp(Src) 98 F (36.7 C) (Oral)  Resp 22  SpO2 100% Physical Exam  Nursing note and vitals reviewed. Constitutional: She is oriented to person, place, and time. She appears well-developed and well-nourished.  Uncomfortable appearing  HENT:  Head: Normocephalic and atraumatic.  Eyes:  Normal appearance  Neck: Normal range of motion.  Cardiovascular: Normal rate and regular rhythm.   Pulmonary/Chest: Effort normal.  Diffuse expiratory wheeze/rhonchi that resolves w/ coughing  Genitourinary:  No CVA ttp  Musculoskeletal:  Vertical surgical scar over lumbar spine.  Mild, diffsue low back ttp.  Full active ROM and symmetric, 4/5 strength of LE.  Nml patellar reflexes.  No saddle anesthesia. Distal sensation intact.  2+ DP pulses.    Neurological: She is alert and oriented to person, place, and time.  Skin: Skin is warm and dry. No rash noted.  Psychiatric: She has a normal mood and affect. Her behavior is normal.    ED Course  Procedures (including critical care time) Labs Reviewed - No data to display No results found. 1. Chronic back pain   2. Asthma attack     MDM  47yo F presents w/ non-traumatic, acute on chronic low back pain.  Seen for same 5d ago.  Surgery scheduled in [place] on 11/09/12.  No fever or NV deficits on exam.  Will treat symptomatically w/ 1mg  IM dilaudid.  Also c/o asthma attack.  Has received one neb so far.  Wheezing/rhonchi clear w/ single cough on exam.  She reports feeling better and declines another treatment at this time.  60mg  po prednisone ordered.   9:32 PM   Pt ambulatory.  Reports no relief w/ dilaudid.  I offered one more dose at time of discharge.  We discussed my suspicion for malingering and I told her that she needs to see her own physician or go to the ER associated w/ her physician if she experiences acutely worsened pain.  Sx that should prompt immediate return to ER discussed as well.  D/c'd home w/ referral to pain management and po prednisone to treat asthma exacerbation as well as pain.  10:44 PM   Otilio Miu, PA-C 10/14/12 2246

## 2012-10-14 NOTE — ED Notes (Signed)
Pt c/o pain med ineffective. PA notified.

## 2012-10-14 NOTE — ED Notes (Signed)
Pt states she is having difficulty breathing due to her asthma  Pt states she was seen here the other day for same  Pt states she has been using her inhaler, nebulizer, and home oxygen without relief

## 2012-10-15 NOTE — ED Provider Notes (Signed)
Medical screening examination/treatment/procedure(s) were performed by non-physician practitioner and as supervising physician I was immediately available for consultation/collaboration.    Sydne Krahl L Demika Langenderfer, MD 10/15/12 1527 

## 2012-10-24 ENCOUNTER — Encounter (HOSPITAL_COMMUNITY): Payer: Self-pay | Admitting: *Deleted

## 2012-10-24 ENCOUNTER — Emergency Department (HOSPITAL_COMMUNITY)
Admission: EM | Admit: 2012-10-24 | Discharge: 2012-10-24 | Disposition: A | Discharge status: Home / Self Care | Payer: Medicaid Other | Attending: Emergency Medicine | Admitting: Emergency Medicine

## 2012-10-24 DIAGNOSIS — M549 Dorsalgia, unspecified: Secondary | ICD-10-CM | POA: Insufficient documentation

## 2012-10-24 DIAGNOSIS — Z79899 Other long term (current) drug therapy: Secondary | ICD-10-CM | POA: Insufficient documentation

## 2012-10-24 DIAGNOSIS — F17200 Nicotine dependence, unspecified, uncomplicated: Secondary | ICD-10-CM | POA: Insufficient documentation

## 2012-10-24 DIAGNOSIS — G8929 Other chronic pain: Secondary | ICD-10-CM

## 2012-10-24 DIAGNOSIS — J45909 Unspecified asthma, uncomplicated: Secondary | ICD-10-CM | POA: Insufficient documentation

## 2012-10-24 LAB — URINE MICROSCOPIC-ADD ON

## 2012-10-24 LAB — URINALYSIS, ROUTINE W REFLEX MICROSCOPIC
Bilirubin Urine: NEGATIVE
Glucose, UA: NEGATIVE mg/dL
Hgb urine dipstick: NEGATIVE
Ketones, ur: NEGATIVE mg/dL
Nitrite: NEGATIVE
Protein, ur: NEGATIVE mg/dL
Specific Gravity, Urine: 1.022 (ref 1.005–1.030)
Urobilinogen, UA: 0.2 mg/dL (ref 0.0–1.0)
pH: 6 (ref 5.0–8.0)

## 2012-10-24 MED ORDER — CYCLOBENZAPRINE HCL 10 MG PO TABS: 10.0000 mg | ORAL_TABLET | Freq: Two times a day (BID) | ORAL | Status: DC | PRN

## 2012-10-24 MED ORDER — CYCLOBENZAPRINE HCL 10 MG PO TABS
5.0000 mg | ORAL_TABLET | Freq: Once | ORAL | Status: DC
  Filled 2012-10-24: qty 1

## 2012-10-24 MED ORDER — HYDROCODONE-ACETAMINOPHEN 5-325 MG PO TABS
2.0000 | ORAL_TABLET | Freq: Once | ORAL | Status: DC
  Filled 2012-10-24: qty 2

## 2012-10-24 MED ORDER — IBUPROFEN 200 MG PO TABS
600.0000 mg | ORAL_TABLET | Freq: Once | ORAL | Status: AC
  Administered 2012-10-24: 600 mg via ORAL
  Filled 2012-10-24: qty 3

## 2012-10-24 MED ORDER — HYDROCODONE-ACETAMINOPHEN 5-325 MG PO TABS: 2.0000 | ORAL_TABLET | ORAL | Status: DC | PRN

## 2012-10-24 NOTE — ED Notes (Signed)
Pt resting quietly at the time. Ambulatory to bathroom. No signs of distress noted. 5/10 back pain at present.

## 2012-10-24 NOTE — ED Notes (Signed)
Pt states she needs to leave to see father. Resident notified.

## 2012-10-24 NOTE — ED Notes (Addendum)
Pt here with c/o back pain, h/o chronic back pain, exacerbated recently d/t taking care of father in hospice, pt doing a lot of lifting, h/o ruptured and bulging discs, back surgery (Dr. Manson Passey in [place] 1 yr ago, has appt in august), also reports leg weakness, radiated down bilateral legs, mild tingling, legs give out, (denies: nvd, fever, urinary or vaginal sx, bleeding, falls), last BM yesterday (normal), no meds PTA.

## 2012-10-24 NOTE — ED Notes (Signed)
Pt into gown by self, alert, NAD, calm, interactive, skin W&D, resps e/u, speaking in clear complete sentences, steady gait w/in room, attempting urine sample in b/r (pt needing to void).

## 2012-10-24 NOTE — ED Provider Notes (Signed)
History    CSN: [id number] Arrival date & time 10/24/12  2725  First MD Initiated Contact with Patient 10/24/12 [phone]     Chief Complaint  Patient presents with  . Back Pain   (Consider location/radiation/quality/duration/timing/severity/associated sxs/prior Treatment) HPI Comments: 47 yo female with chronic back pain since surgeries one and two years ago. Pt saw pcp and referred to pain specialist, no fup with surgery until mid August.  Pt frustrated with everyone sending her to the ED for pain control.  Pt was on percocet.  Patient denies urinary or bowel changes, cancer history, extremity weakness, IVDU, fevers, weight loss, or significant trauma.  Pain lower and worse with movement.  Non radiating.   Patient is a 47 y.o. female presenting with back pain. The history is provided by the patient.  Back Pain Associated symptoms: no abdominal pain, no chest pain, no dysuria, no fever, no headaches, no numbness and no weakness    Past Medical History  Diagnosis Date  . Asthma   . Chronic back pain    Past Surgical History  Procedure Laterality Date  . Back surgery    . Abdominal hysterectomy    . Insertion of mesh     Family History  Problem Relation Age of Onset  . Cancer Mother   . Cancer Father   . Diabetes Other   . Hypertension Other    History  Substance Use Topics  . Smoking status: Current Every Day Smoker -- 0.50 packs/day    Types: Cigarettes  . Smokeless tobacco: Not on file  . Alcohol Use: No   OB History   Grav Para Term Preterm Abortions TAB SAB Ect Mult Living                 Review of Systems  Constitutional: Negative for fever and chills.  HENT: Negative for neck pain.   Eyes: Negative for visual disturbance.  Respiratory: Negative for shortness of breath.   Cardiovascular: Negative for chest pain.  Gastrointestinal: Negative for vomiting and abdominal pain.  Genitourinary: Negative for dysuria.  Musculoskeletal: Positive for back pain.  Skin:  Negative for rash.  Neurological: Negative for weakness, light-headedness, numbness and headaches.    Allergies  Compazine and Ketorolac tromethamine  Home Medications   Current Outpatient Rx  Name  Route  Sig  Dispense  Refill  . albuterol (PROVENTIL HFA;VENTOLIN HFA) 108 (90 BASE) MCG/ACT inhaler   Inhalation   Inhale 2 puffs into the lungs every 6 (six) hours as needed for wheezing.         Marland Kitchen ALPRAZolam (XANAX) 1 MG tablet   Oral   Take 1 mg by mouth 2 (two) times daily as needed for sleep or anxiety.         Marland Kitchen ipratropium-albuterol (DUONEB) 0.5-2.5 (3) MG/3ML SOLN   Nebulization   Take 3 mLs by nebulization every 6 (six) hours as needed. For shortness of breath         . zolpidem (AMBIEN) 5 MG tablet   Oral   Take 5 mg by mouth at bedtime as needed for sleep.         . cyclobenzaprine (FLEXERIL) 10 MG tablet   Oral   Take 1 tablet (10 mg total) by mouth 2 (two) times daily as needed for muscle spasms.   10 tablet   0   . HYDROcodone-acetaminophen (NORCO/VICODIN) 5-325 MG per tablet   Oral   Take 2 tablets by mouth every 4 (four) hours as needed  for pain.   8 tablet   0   . predniSONE (DELTASONE) 20 MG tablet   Oral   Take 1 tablet (20 mg total) by mouth 2 (two) times daily.   10 tablet   0    BP 113/76  Pulse 104  Temp(Src) 98.3 F (36.8 C) (Oral)  Resp 18  SpO2 97% Physical Exam  Nursing note and vitals reviewed. Constitutional: She is oriented to person, place, and time. She appears well-developed and well-nourished.  HENT:  Head: Normocephalic and atraumatic.  Eyes: Conjunctivae are normal. Right eye exhibits no discharge. Left eye exhibits no discharge.  Neck: Normal range of motion. Neck supple. No tracheal deviation present.  Pulmonary/Chest: Effort normal.  Abdominal: Soft. There is no tenderness. There is no guarding.  Musculoskeletal: She exhibits tenderness (lumbosacral junction, no lumbar or thoractic midline tenderness, no warmth or  erythema). She exhibits no edema.  Neurological: She is alert and oriented to person, place, and time. No sensory deficit.  Reflex Scores:      Patellar reflexes are 2+ on the right side and 2+ on the left side.      Achilles reflexes are 1+ on the right side and 1+ on the left side. 5+ strength LE with f/e at hips, knees and great toes bilateral. Sensation to palpation intact in major n regions in LE bilateral.    Skin: Skin is warm. No rash noted.  Psychiatric: She has a normal mood and affect.    ED Course  Procedures (including critical care time) Labs Reviewed  URINALYSIS, ROUTINE W REFLEX MICROSCOPIC - Abnormal; Notable for the following:    APPearance CLOUDY (*)    Leukocytes, UA SMALL (*)    All other components within normal limits  URINE MICROSCOPIC-ADD ON - Abnormal; Notable for the following:    Squamous Epithelial / LPF MANY (*)    Bacteria, UA MANY (*)    All other components within normal limits  URINE CULTURE   No results found. 1. Back pain, chronic     MDM  Chronic pain, no new issues.  Discussed importance of fup with pain dr, pcp and surgery. Well appearing in ED.  Vitals unremarkable, mild tachy from pain. Pain eds po given in ED, explained why I did not feel IV or IM would help her as she would have to go home with po meds.   Pt comfortable walking to bathroom without difficulty.  Discharge discussed.   Enid Skeens, MD 10/24/12 [phone]

## 2012-10-25 LAB — URINE CULTURE: Colony Count: 9000

## 2012-10-28 ENCOUNTER — Emergency Department (HOSPITAL_COMMUNITY): Payer: Medicaid Other

## 2012-10-28 ENCOUNTER — Emergency Department (HOSPITAL_COMMUNITY)
Admission: EM | Admit: 2012-10-28 | Discharge: 2012-10-29 | Disposition: A | Discharge status: Home / Self Care | Payer: Medicaid Other | Attending: Emergency Medicine | Admitting: Emergency Medicine

## 2012-10-28 ENCOUNTER — Encounter (HOSPITAL_COMMUNITY): Payer: Self-pay | Admitting: Emergency Medicine

## 2012-10-28 DIAGNOSIS — W010XXA Fall on same level from slipping, tripping and stumbling without subsequent striking against object, initial encounter: Secondary | ICD-10-CM | POA: Insufficient documentation

## 2012-10-28 DIAGNOSIS — Y92009 Unspecified place in unspecified non-institutional (private) residence as the place of occurrence of the external cause: Secondary | ICD-10-CM | POA: Insufficient documentation

## 2012-10-28 DIAGNOSIS — M549 Dorsalgia, unspecified: Secondary | ICD-10-CM

## 2012-10-28 DIAGNOSIS — G8929 Other chronic pain: Secondary | ICD-10-CM

## 2012-10-28 DIAGNOSIS — IMO0002 Reserved for concepts with insufficient information to code with codable children: Secondary | ICD-10-CM | POA: Insufficient documentation

## 2012-10-28 DIAGNOSIS — J45909 Unspecified asthma, uncomplicated: Secondary | ICD-10-CM | POA: Insufficient documentation

## 2012-10-28 DIAGNOSIS — F17200 Nicotine dependence, unspecified, uncomplicated: Secondary | ICD-10-CM | POA: Insufficient documentation

## 2012-10-28 DIAGNOSIS — Z8739 Personal history of other diseases of the musculoskeletal system and connective tissue: Secondary | ICD-10-CM | POA: Insufficient documentation

## 2012-10-28 DIAGNOSIS — Z79899 Other long term (current) drug therapy: Secondary | ICD-10-CM | POA: Insufficient documentation

## 2012-10-28 DIAGNOSIS — Y93E1 Activity, personal bathing and showering: Secondary | ICD-10-CM | POA: Insufficient documentation

## 2012-10-28 MED ORDER — ALBUTEROL SULFATE (5 MG/ML) 0.5% IN NEBU
INHALATION_SOLUTION | RESPIRATORY_TRACT | Status: AC
  Administered 2012-10-28: 2.5 mg via RESPIRATORY_TRACT
  Filled 2012-10-28: qty 0.5

## 2012-10-28 MED ORDER — IPRATROPIUM BROMIDE 0.02 % IN SOLN: 0.5000 mg | Freq: Once | RESPIRATORY_TRACT | Status: DC

## 2012-10-28 MED ORDER — ALBUTEROL SULFATE (5 MG/ML) 0.5% IN NEBU: 2.5000 mg | INHALATION_SOLUTION | Freq: Once | RESPIRATORY_TRACT | Status: DC

## 2012-10-28 MED ORDER — IPRATROPIUM BROMIDE 0.02 % IN SOLN
RESPIRATORY_TRACT | Status: AC
  Administered 2012-10-28: 0.5 mg via RESPIRATORY_TRACT
  Filled 2012-10-28: qty 2.5

## 2012-10-28 NOTE — ED Notes (Addendum)
Pt reports increase in SOB with chest tightness; audible wheezing noted - pt slightly tachypneic with resp 26/min; however, able to speak in complete sentences; placed in triage room; inspiratory and expiratory wheezing noted in upper and middle lung fields; decreased breath sounds noted in lower lobes bilat; spoke with PA in [facility] in reference to same - orders given; chest xray ordered as well.

## 2012-10-28 NOTE — ED Notes (Signed)
Breathing tx complete; resp now 24/min; inspiratory wheezing has dissipated; however, now with scant amount of expiratory wheezing throughout; pt appears much more comfortable than prior to breathing treatment

## 2012-10-28 NOTE — ED Notes (Signed)
PT. SLIPPED AND FELL AT BATHTUB THIS EVENING AT HOME , NO LOC / AMBULATORY , REPORTS PAIN AT LOWER BACK ,  PT. 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 DENIES HEMATURIA , PT. ALSO REPORTED SLIGHT SOB AND OCCASIONAL PRODUCTIVE COUGH.

## 2012-10-28 NOTE — ED Notes (Signed)
Patient ambulatory to restroom. No acute distress noted at this time.

## 2012-10-28 NOTE — ED Notes (Signed)
NURSE FIRST: updated patient on wait; pt ambulatory with no assistance. AAOx4, resp e/u, NAD noted at this time.

## 2012-10-29 MED ORDER — HYDROMORPHONE HCL PF 1 MG/ML IJ SOLN
1.0000 mg | INTRAMUSCULAR | Status: AC
  Administered 2012-10-29: 1 mg via INTRAVENOUS
  Filled 2012-10-29: qty 1

## 2012-10-29 MED ORDER — IPRATROPIUM BROMIDE 0.02 % IN SOLN
0.5000 mg | RESPIRATORY_TRACT | Status: DC
  Administered 2012-10-29: 0.5 mg via RESPIRATORY_TRACT
  Filled 2012-10-29: qty 2.5

## 2012-10-29 MED ORDER — ONDANSETRON HCL 4 MG/2ML IJ SOLN
4.0000 mg | Freq: Once | INTRAMUSCULAR | Status: AC
  Administered 2012-10-29: 4 mg via INTRAVENOUS
  Filled 2012-10-29: qty 2

## 2012-10-29 MED ORDER — HYDROMORPHONE HCL PF 1 MG/ML IJ SOLN
1.0000 mg | Freq: Once | INTRAMUSCULAR | Status: AC
  Administered 2012-10-29: 1 mg via INTRAVENOUS
  Filled 2012-10-29: qty 1

## 2012-10-29 MED ORDER — ALBUTEROL SULFATE (5 MG/ML) 0.5% IN NEBU
2.5000 mg | INHALATION_SOLUTION | RESPIRATORY_TRACT | Status: DC
  Administered 2012-10-29: 2.5 mg via RESPIRATORY_TRACT
  Filled 2012-10-29: qty 0.5

## 2012-10-29 MED ORDER — KETAMINE HCL 10 MG/ML IJ SOLN
8.0000 mg | Freq: Once | INTRAMUSCULAR | Status: AC
  Administered 2012-10-29: 8 mg via INTRAVENOUS

## 2012-10-29 NOTE — ED Notes (Signed)
Two unsuccessful IV attempt made.  On in right AC, and in right wrist.

## 2012-10-29 NOTE — ED Provider Notes (Signed)
History    CSN: [id number] Arrival date & time 10/28/12  2205  First MD Initiated Contact with Patient 10/28/12 2356     Chief Complaint  Patient presents with  . Back Pain   HPI Deanna Marshall is a 47 y.o. female history of chronic back pain he says he slipped and fell in her bathtub at home. She did not hit her head and denies any loss of consciousness. She says this exacerbated her chronic low back pain. She's had surgery in the low back, this is exactly where she has pain now in the paraspinous muscles. The pain radiates into the bilateral thighs but does not ascend past the knee, she has no numbness or tingling, denies any weakness, no urinary or fecal incontinence, no urinary retention she denies saddle anesthesia. This began acutely, has not been going on for a long period of time, no fevers, no chills. She denies any chest pain, shortness of breath, nausea, vomiting, abdominal pain   Past Medical History  Diagnosis Date  . Asthma   . Chronic back pain    Past Surgical History  Procedure Laterality Date  . Back surgery    . Abdominal hysterectomy    . Insertion of mesh     Family History  Problem Relation Age of Onset  . Cancer Mother   . Cancer Father   . Diabetes Other   . Hypertension Other    History  Substance Use Topics  . Smoking status: Current Every Day Smoker -- 0.50 packs/day    Types: Cigarettes  . Smokeless tobacco: Not on file  . Alcohol Use: No   OB History   Grav Para Term Preterm Abortions TAB SAB Ect Mult Living                 Review of Systems At least 10pt or greater review of systems completed and are negative except where specified in the HPI.  Allergies  Compazine; Ketorolac tromethamine; and Morphine and related  Home Medications   Current Outpatient Rx  Name  Route  Sig  Dispense  Refill  . albuterol (PROVENTIL HFA;VENTOLIN HFA) 108 (90 BASE) MCG/ACT inhaler   Inhalation   Inhale 2 puffs into the lungs every 6 (six) hours as needed  for wheezing or shortness of breath.          . ALPRAZolam (XANAX) 1 MG tablet   Oral   Take 1 mg by mouth 2 (two) times daily as needed for sleep or anxiety.         . cyclobenzaprine (FLEXERIL) 10 MG tablet   Oral   Take 1 tablet (10 mg total) by mouth 2 (two) times daily as needed for muscle spasms.   10 tablet   0   . HYDROcodone-acetaminophen (NORCO/VICODIN) 5-325 MG per tablet   Oral   Take 2 tablets by mouth every 4 (four) hours as needed for pain.   8 tablet   0   . ipratropium-albuterol (DUONEB) 0.5-2.5 (3) MG/3ML SOLN   Nebulization   Take 3 mLs by nebulization every 6 (six) hours as needed (SOB).          . predniSONE (DELTASONE) 20 MG tablet   Oral   Take 1 tablet (20 mg total) by mouth 2 (two) times daily.   10 tablet   0   . zolpidem (AMBIEN) 5 MG tablet   Oral   Take 5 mg by mouth at bedtime as needed for sleep.  BP 140/85  Pulse 99  Temp(Src) 98 F (36.7 C) (Oral)  Resp 14  SpO2 99% Physical Exam  Nursing notes reviewed.  Electronic medical record reviewed. VITAL SIGNS:   Filed Vitals:   10/28/12 2222 10/29/12 0043  BP: 140/85   Pulse: 99   Temp: 98 F (36.7 C)   TempSrc: Oral   Resp: 14   SpO2: 99% 99%   CONSTITUTIONAL: Awake, oriented, appears non-toxic HENT: Atraumatic, normocephalic, oral mucosa pink and moist, airway patent. Nares patent without drainage. External ears normal. EYES: Conjunctiva clear, EOMI, PERRLA NECK: Trachea midline, non-tender, supple CARDIOVASCULAR: Normal heart rate, Normal rhythm, No murmurs, rubs, gallops PULMONARY/CHEST: Clear to auscultation, no rhonchi, wheezes, or rales. Symmetrical breath sounds. Non-tender. ABDOMINAL: Non-distended, soft, non-tender - no rebound or guarding.  BS normal. BACK: No step-offs or deformities, nontender to palpation in the midline, tenderness to palpation in the paraspinous muscles at L4-L5 in the SI joint in the posterior superior iliac crest. No skin  abnormalities. NEUROLOGIC: Non-focal, moving all four extremities, no gross sensory or motor deficits. EXTREMITIES: No clubbing, cyanosis, or edema SKIN: Warm, Dry, No erythema, No rash  ED Course  Procedures (including critical care time) Labs Reviewed - No data to display Dg Chest 2 View  10/28/2012   *RADIOLOGY REPORT*  Clinical Data: Shortness of breath.  Back pain.  History of asthma.  CHEST - 2 VIEW  Comparison: 10/09/2012  Findings: Pulmonary hyperinflation.  Peribronchial thickening and perihilar interstitial changes suggesting chronic bronchitis. Normal heart size and pulmonary vascularity.  No focal airspace disease or consolidation in the lungs.  No blunting of costophrenic angles.  No pneumothorax.  Mediastinal contours appear intact. Surgical clips in the right upper quadrant.  IMPRESSION: Hyperinflation suggesting emphysematous change.  Chronic bronchitic changes.  No evidence of active pulmonary disease.   Original Report Authenticated By: Burman Nieves, M.D.   Dg Lumbar Spine Complete  10/28/2012   *RADIOLOGY REPORT*  Clinical Data: Low back pain after fall yesterday.  LUMBAR SPINE - COMPLETE 4+ VIEW  Comparison: None.  Findings: Five lumbar type vertebrae.  Normal alignment of the lumbar vertebrae and facet joints.  Mild degenerative narrowing of the L5-S1 interspace.  Intervertebral disc space heights are otherwise preserved.  No vertebral compression deformities.  No focal bone lesion or bone destruction.  Bone cortex and trabecular architecture appear intact.  Surgical clips in the abdomen.  IMPRESSION: No displaced fractures demonstrated in the lumbar spine.   Original Report Authenticated By: Burman Nieves, M.D.   1. Chronic back pain     MDM  Patient fell out of the bathtub presents with acute on chronic back pain. I will treat the patient's pain in the emergency department, she says she has an appointment with pain management next week, I do not think it is in her best  interest to prescribe her more pain medicine as she was just prescribed pain medicine a few days ago.  Patient's pain was treated aggressively while in the emergency department. She asked for something to go home with, based on her history of chronic pain, and not in her best interest to send her home with a prescription for more pain medicine. She says she does have a chronic pain management physician. Patient understands accepts medical plans been dictated we discharged him stable and in good condition. I do not think this patient has acute neurologic injury to her low back, x-rays are unremarkable for fracture, there is no retrolisthesis, nothing appears new on this x-ray-patient  presentation is not consistent with cauda equina syndrome or any other acute cord syndrome.  Jones Skene, MD 10/29/12 [phone]

## 2012-10-30 ENCOUNTER — Emergency Department (HOSPITAL_COMMUNITY)
Admission: EM | Admit: 2012-10-30 | Discharge: 2012-10-30 | Discharge status: Against Medical Advice | Payer: Medicaid Other | Source: Home / Self Care | Attending: Emergency Medicine | Admitting: Emergency Medicine

## 2012-10-30 ENCOUNTER — Emergency Department (HOSPITAL_COMMUNITY)
Admission: EM | Admit: 2012-10-30 | Discharge: 2012-10-30 | Discharge status: Against Medical Advice | Payer: Medicaid Other | Attending: Emergency Medicine | Admitting: Emergency Medicine

## 2012-10-30 ENCOUNTER — Encounter (HOSPITAL_COMMUNITY): Payer: Self-pay | Admitting: Emergency Medicine

## 2012-10-30 DIAGNOSIS — Z9889 Other specified postprocedural states: Secondary | ICD-10-CM | POA: Insufficient documentation

## 2012-10-30 DIAGNOSIS — M545 Low back pain, unspecified: Secondary | ICD-10-CM | POA: Insufficient documentation

## 2012-10-30 DIAGNOSIS — J45909 Unspecified asthma, uncomplicated: Secondary | ICD-10-CM | POA: Insufficient documentation

## 2012-10-30 DIAGNOSIS — M549 Dorsalgia, unspecified: Secondary | ICD-10-CM

## 2012-10-30 DIAGNOSIS — F17200 Nicotine dependence, unspecified, uncomplicated: Secondary | ICD-10-CM | POA: Insufficient documentation

## 2012-10-30 DIAGNOSIS — G8921 Chronic pain due to trauma: Secondary | ICD-10-CM | POA: Insufficient documentation

## 2012-10-30 DIAGNOSIS — Z79899 Other long term (current) drug therapy: Secondary | ICD-10-CM | POA: Insufficient documentation

## 2012-10-30 DIAGNOSIS — IMO0002 Reserved for concepts with insufficient information to code with codable children: Secondary | ICD-10-CM | POA: Insufficient documentation

## 2012-10-30 DIAGNOSIS — G8929 Other chronic pain: Secondary | ICD-10-CM

## 2012-10-30 MED ORDER — IBUPROFEN 800 MG PO TABS
800.0000 mg | ORAL_TABLET | Freq: Once | ORAL | Status: DC
  Filled 2012-10-30: qty 1

## 2012-10-30 MED ORDER — ACETAMINOPHEN 325 MG PO TABS
650.0000 mg | ORAL_TABLET | Freq: Once | ORAL | Status: AC
  Administered 2012-10-30: 650 mg via ORAL
  Filled 2012-10-30: qty 2

## 2012-10-30 NOTE — ED Notes (Signed)
Pt. Left after speaking with Dr. Dan Humphreys.  Encouraged pt. To stay, she stated, "I have to get out of here"

## 2012-10-30 NOTE — ED Notes (Signed)
Pt ambulated to the restroom without difficulty and without distress. When pt returned she stated th

## 2012-10-30 NOTE — ED Provider Notes (Signed)
CSN: [id number]     Arrival date & time 10/30/12  4098 History     First MD Initiated Contact with Patient 10/30/12 [phone]     Chief Complaint  Patient presents with  . Back Pain   (Consider location/radiation/quality/duration/timing/severity/associated sxs/prior Treatment) The history is provided by the patient and medical records.   Pt presents to the ED for chronic back pain after falling out of the bathtub, last seen < 24 hours ago for the same with aggressive pain management in th ED and x-ray of LS which was negative for acute fx.  Pain radiates into both LE, but does not pass the knee.  Notes pain is far more intense than yesterday and her legs have been spasming intermittently throughout the night preventing her from sleeping.  Admits to some associated numbness and paresthesias of her left foot.  Walking makes pain worse, nothing makes the pain better.  Pt states she has a herniated disc and prior fusion of L4-L5 vertebrae.  No MRI within the past year.  Pt is currently established with pain management clinic in [place]. [place], will see them on Friday 11/06/12.  No loss of bowel or bladder function.  Pt states she has taken 3 tramadol this morning without significant relief.  Past Medical History  Diagnosis Date  . Asthma   . Chronic back pain    Past Surgical History  Procedure Laterality Date  . Back surgery    . Abdominal hysterectomy    . Insertion of mesh     Family History  Problem Relation Age of Onset  . Cancer Mother   . Cancer Father   . Diabetes Other   . Hypertension Other    History  Substance Use Topics  . Smoking status: Current Every Day Smoker -- 0.50 packs/day    Types: Cigarettes  . Smokeless tobacco: Not on file  . Alcohol Use: No   OB History   Grav Para Term Preterm Abortions TAB SAB Ect Mult Living                 Review of Systems  Musculoskeletal: Positive for back pain.  All other systems reviewed and are negative.    Allergies  Compazine;  Ketorolac tromethamine; and Morphine and related  Home Medications   Current Outpatient Rx  Name  Route  Sig  Dispense  Refill  . albuterol (PROVENTIL HFA;VENTOLIN HFA) 108 (90 BASE) MCG/ACT inhaler   Inhalation   Inhale 2 puffs into the lungs every 6 (six) hours as needed for wheezing or shortness of breath.          . ALPRAZolam (XANAX) 1 MG tablet   Oral   Take 1 mg by mouth 2 (two) times daily as needed for sleep or anxiety.         . cyclobenzaprine (FLEXERIL) 10 MG tablet   Oral   Take 1 tablet (10 mg total) by mouth 2 (two) times daily as needed for muscle spasms.   10 tablet   0   . ipratropium-albuterol (DUONEB) 0.5-2.5 (3) MG/3ML SOLN   Nebulization   Take 3 mLs by nebulization every 6 (six) hours as needed (SOB).          . naproxen (NAPROSYN) 250 MG tablet   Oral   Take 250 mg by mouth 4 (four) times daily as needed (pain).         . predniSONE (DELTASONE) 20 MG tablet   Oral   Take 1 tablet (20 mg  total) by mouth 2 (two) times daily.   10 tablet   0   . zolpidem (AMBIEN) 5 MG tablet   Oral   Take 5 mg by mouth at bedtime as needed for sleep.          BP 121/66  Pulse 96  Temp(Src) 98 F (36.7 C) (Oral)  Resp 20  SpO2 99%  Physical Exam  Nursing note and vitals reviewed. Constitutional: She is oriented to person, place, and time. She appears well-developed and well-nourished. No distress.  HENT:  Head: Normocephalic and atraumatic.  Eyes: Conjunctivae and EOM are normal. Pupils are equal, round, and reactive to light.  Neck: Normal range of motion. Neck supple.  Cardiovascular: Normal rate, regular rhythm and normal heart sounds.   Pulmonary/Chest: Effort normal and breath sounds normal. No respiratory distress.  Musculoskeletal: She exhibits no edema.       Lumbar back: She exhibits decreased range of motion, tenderness, bony tenderness and pain. She exhibits no swelling, no edema, no deformity, no laceration, no spasm and normal pulse.   Diffuse TTP of LS, limited ROM due to pain, strong distal pulses, sensation intact, normal gait  Neurological: She is alert and oriented to person, place, and time. She has normal strength. No cranial nerve deficit or sensory deficit. Gait normal.  CN grossly intact, moves all extremities appropriately without ataxia, no focal neuro deficits, Normal gait  Skin: Skin is warm and dry.  Psychiatric: She has a normal mood and affect.    ED Course   Procedures (including critical care time)  Labs Reviewed - No data to display Dg Chest 2 View  10/28/2012   *RADIOLOGY REPORT*  Clinical Data: Shortness of breath.  Back pain.  History of asthma.  CHEST - 2 VIEW  Comparison: 10/09/2012  Findings: Pulmonary hyperinflation.  Peribronchial thickening and perihilar interstitial changes suggesting chronic bronchitis. Normal heart size and pulmonary vascularity.  No focal airspace disease or consolidation in the lungs.  No blunting of costophrenic angles.  No pneumothorax.  Mediastinal contours appear intact. Surgical clips in the right upper quadrant.  IMPRESSION: Hyperinflation suggesting emphysematous change.  Chronic bronchitic changes.  No evidence of active pulmonary disease.   Original Report Authenticated By: Burman Nieves, M.D.   Dg Lumbar Spine Complete  10/28/2012   *RADIOLOGY REPORT*  Clinical Data: Low back pain after fall yesterday.  LUMBAR SPINE - COMPLETE 4+ VIEW  Comparison: None.  Findings: Five lumbar type vertebrae.  Normal alignment of the lumbar vertebrae and facet joints.  Mild degenerative narrowing of the L5-S1 interspace.  Intervertebral disc space heights are otherwise preserved.  No vertebral compression deformities.  No focal bone lesion or bone destruction.  Bone cortex and trabecular architecture appear intact.  Surgical clips in the abdomen.  IMPRESSION: No displaced fractures demonstrated in the lumbar spine.   Original Report Authenticated By: Burman Nieves, M.D.   1. Back  pain, chronic     MDM   Pt requesting narcotic pain medications several times.  I have deferred as pt was treated aggressively yesterday with high dose dilaudid and ketamine and is already established with pain management-- given tylenol as she has already taken a significant amount of tramadol this am.  Will await results of MRI and re-assess.  8:22 AM Pt set for MRI and has been observed walking around the ED numerous times without difficulty.  Pt states she either gets narcotics or she is leaving.  Again discussed that she was adequately treated yesterday and  is established with pain management so she will not be receiving further narcotics.  Pt elected to leave AMA.  Garlon Hatchet, PA-C 10/30/12 [phone]

## 2012-10-30 NOTE — ED Notes (Signed)
Explained to pt. That the Medical doctor / PA will be into speak with her about pain medication.  Pt. Has requested medication for pain. Pt. Has been medicated with Tylenol 650 mg and also pt. Was given Heat pads.  Pt. Got out of bed and attempted to leave , encouraged pt. To stay and speak with the MD.  Pt. oob an ambulated to the bathroom without any difficulty or distress.

## 2012-10-30 NOTE — ED Provider Notes (Signed)
CSN: [id number]     Arrival date & time 10/30/12  0840 History     First MD Initiated Contact with Patient 10/30/12 [phone]     No chief complaint on file.  (Consider location/radiation/quality/duration/timing/severity/associated sxs/prior Treatment) HPI Comments: 47 y/o female presents to the emergency department immediately after being seen at [facility] complaining of chronic low back pain. Patient states she left [facility] "because they did not do anything for her". When mentioning that she was offered an MRI and refused, patient states "I do not need an MRI, I need pain medication". Patient would not elaborate any further, states "forget it, I'm going to leave and deal with this".  The history is provided by the patient.    Past Medical History  Diagnosis Date  . Asthma   . Chronic back pain    Past Surgical History  Procedure Laterality Date  . Back surgery    . Abdominal hysterectomy    . Insertion of mesh     Family History  Problem Relation Age of Onset  . Cancer Mother   . Cancer Father   . Diabetes Other   . Hypertension Other    History  Substance Use Topics  . Smoking status: Current Every Day Smoker -- 0.50 packs/day    Types: Cigarettes  . Smokeless tobacco: Not on file  . Alcohol Use: No   OB History   Grav Para Term Preterm Abortions TAB SAB Ect Mult Living                 Review of Systems  Unable to perform ROS: Other    Allergies  Compazine; Ketorolac tromethamine; and Morphine and related  Home Medications   Current Outpatient Rx  Name  Route  Sig  Dispense  Refill  . albuterol (PROVENTIL HFA;VENTOLIN HFA) 108 (90 BASE) MCG/ACT inhaler   Inhalation   Inhale 2 puffs into the lungs every 6 (six) hours as needed for wheezing or shortness of breath.          . ALPRAZolam (XANAX) 1 MG tablet   Oral   Take 1 mg by mouth 2 (two) times daily as needed for sleep or anxiety.         . cyclobenzaprine (FLEXERIL) 10 MG tablet   Oral   Take 1  tablet (10 mg total) by mouth 2 (two) times daily as needed for muscle spasms.   10 tablet   0   . ipratropium-albuterol (DUONEB) 0.5-2.5 (3) MG/3ML SOLN   Nebulization   Take 3 mLs by nebulization every 6 (six) hours as needed (SOB).          . naproxen (NAPROSYN) 250 MG tablet   Oral   Take 250 mg by mouth 4 (four) times daily as needed (pain).         . predniSONE (DELTASONE) 20 MG tablet   Oral   Take 1 tablet (20 mg total) by mouth 2 (two) times daily.   10 tablet   0   . zolpidem (AMBIEN) 5 MG tablet   Oral   Take 5 mg by mouth at bedtime as needed for sleep.          There were no vitals taken for this visit. Physical Exam  Nursing note and vitals reviewed. Constitutional: She is oriented to person, place, and time. She appears well-developed and well-nourished. No distress.  HENT:  Head: Normocephalic and atraumatic.  Eyes: Conjunctivae are normal.  Pulmonary/Chest: Effort normal.  Neurological: She  is alert and oriented to person, place, and time.  Skin: She is not diaphoretic.  Psychiatric: Her affect is angry. She is agitated.    ED Course   Procedures (including critical care time)  Labs Reviewed - No data to display Dg Chest 2 View  10/28/2012   *RADIOLOGY REPORT*  Clinical Data: Shortness of breath.  Back pain.  History of asthma.  CHEST - 2 VIEW  Comparison: 10/09/2012  Findings: Pulmonary hyperinflation.  Peribronchial thickening and perihilar interstitial changes suggesting chronic bronchitis. Normal heart size and pulmonary vascularity.  No focal airspace disease or consolidation in the lungs.  No blunting of costophrenic angles.  No pneumothorax.  Mediastinal contours appear intact. Surgical clips in the right upper quadrant.  IMPRESSION: Hyperinflation suggesting emphysematous change.  Chronic bronchitic changes.  No evidence of active pulmonary disease.   Original Report Authenticated By: Burman Nieves, M.D.   Dg Lumbar Spine  Complete  10/28/2012   *RADIOLOGY REPORT*  Clinical Data: Low back pain after fall yesterday.  LUMBAR SPINE - COMPLETE 4+ VIEW  Comparison: None.  Findings: Five lumbar type vertebrae.  Normal alignment of the lumbar vertebrae and facet joints.  Mild degenerative narrowing of the L5-S1 interspace.  Intervertebral disc space heights are otherwise preserved.  No vertebral compression deformities.  No focal bone lesion or bone destruction.  Bone cortex and trabecular architecture appear intact.  Surgical clips in the abdomen.  IMPRESSION: No displaced fractures demonstrated in the lumbar spine.   Original Report Authenticated By: Burman Nieves, M.D.   1. Chronic back pain     MDM  Patient quickly stood up from wheelchair, walked out of the emergency department without any problem, no apparent distress, all before physical exam could be performed.  Trevor Mace, PA-C 10/30/12 [phone]

## 2012-10-30 NOTE — ED Notes (Signed)
Pt not in room or waiting area.

## 2012-10-30 NOTE — ED Provider Notes (Signed)
At 0700 hours I discussed the patient's care with Dr. Dierdre Highman. Plan for MRI. He has already had extensive conversation with her regarding pain management and that no further narcotics will be given in the emergency department at this time.  8:23 AM Patient was noted by nursing staff to be pacing all around the emergency department stating she does not have narcotics now she will leave. I spoke with the patient, and I continue to offer her the MRI, and again explained the plan per Dr. Dierdre Highman. She states that she does "not have to take this", does not want an MRI, and will leave.  The patient appears to have capacity; therefore, no plan to keep her against her wishes. The patient will leave AMA.  I told her that I would get discharge instructions for her, but she refused to wait.  Ashby Dawes, MD 10/30/12 [phone]

## 2012-10-30 NOTE — ED Notes (Signed)
Pt reports falling in her shower Tuesday morning. Pt reports constant 8/10 lower back pain since. Pt denies LOC during fall.

## 2012-10-31 NOTE — ED Provider Notes (Signed)
Medical screening examination/treatment/procedure(s) were performed by non-physician practitioner and as supervising physician I was immediately available for consultation/collaboration.  Sunnie Nielsen, MD 10/31/12 [phone]

## 2012-11-02 NOTE — ED Provider Notes (Signed)
Medical screening examination/treatment/procedure(s) were performed by non-physician practitioner and as supervising physician I was immediately available for consultation/collaboration.   Gwyneth Sprout, MD 11/02/12 [phone]

## 2013-04-05 DIAGNOSIS — J449 Chronic obstructive pulmonary disease, unspecified: Secondary | ICD-10-CM | POA: Insufficient documentation

## 2013-04-06 DIAGNOSIS — R102 Pelvic and perineal pain: Secondary | ICD-10-CM | POA: Insufficient documentation

## 2013-07-27 DIAGNOSIS — F1920 Other psychoactive substance dependence, uncomplicated: Secondary | ICD-10-CM | POA: Insufficient documentation

## 2013-07-27 DIAGNOSIS — E7800 Pure hypercholesterolemia, unspecified: Secondary | ICD-10-CM | POA: Insufficient documentation

## 2014-01-22 IMAGING — CR DG LUMBAR SPINE COMPLETE 4+V
5 series · 5 of 5 positions shown · non-contrast
Comparison: None.

CLINICAL DATA: Low back pain after fall yesterday.

LUMBAR SPINE - COMPLETE 4+ VIEW

[t l-spine a.p.]
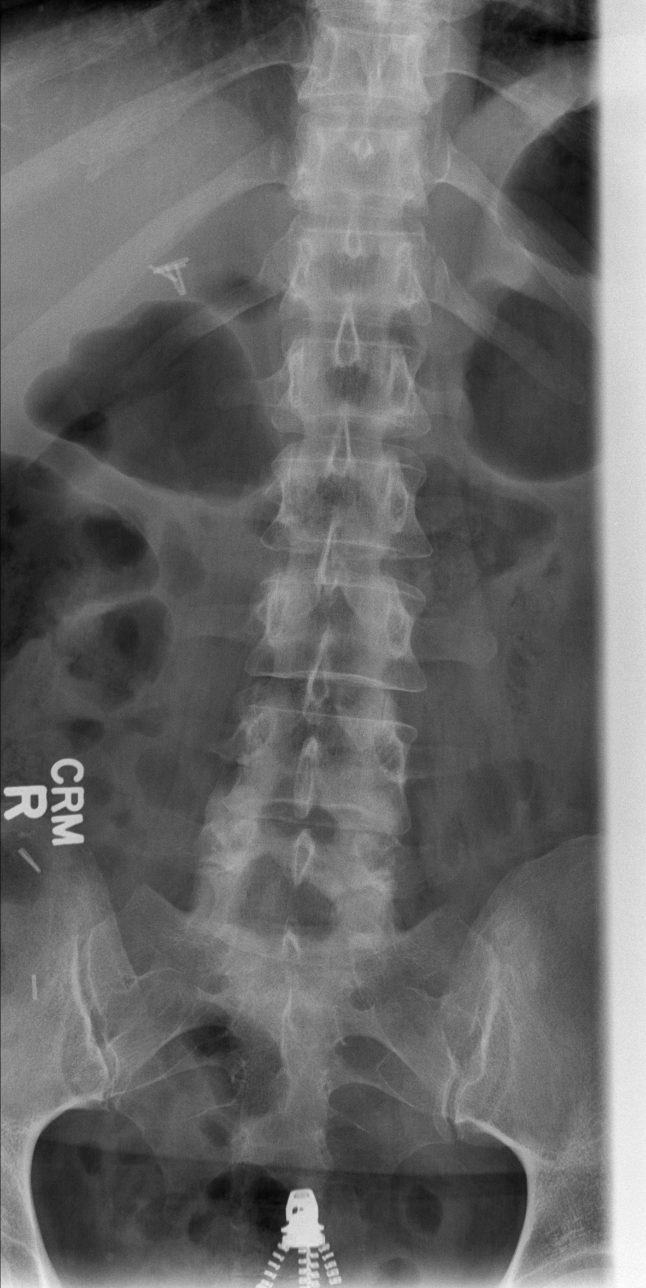

[t l-spine oblique exposure (1 of 2)]
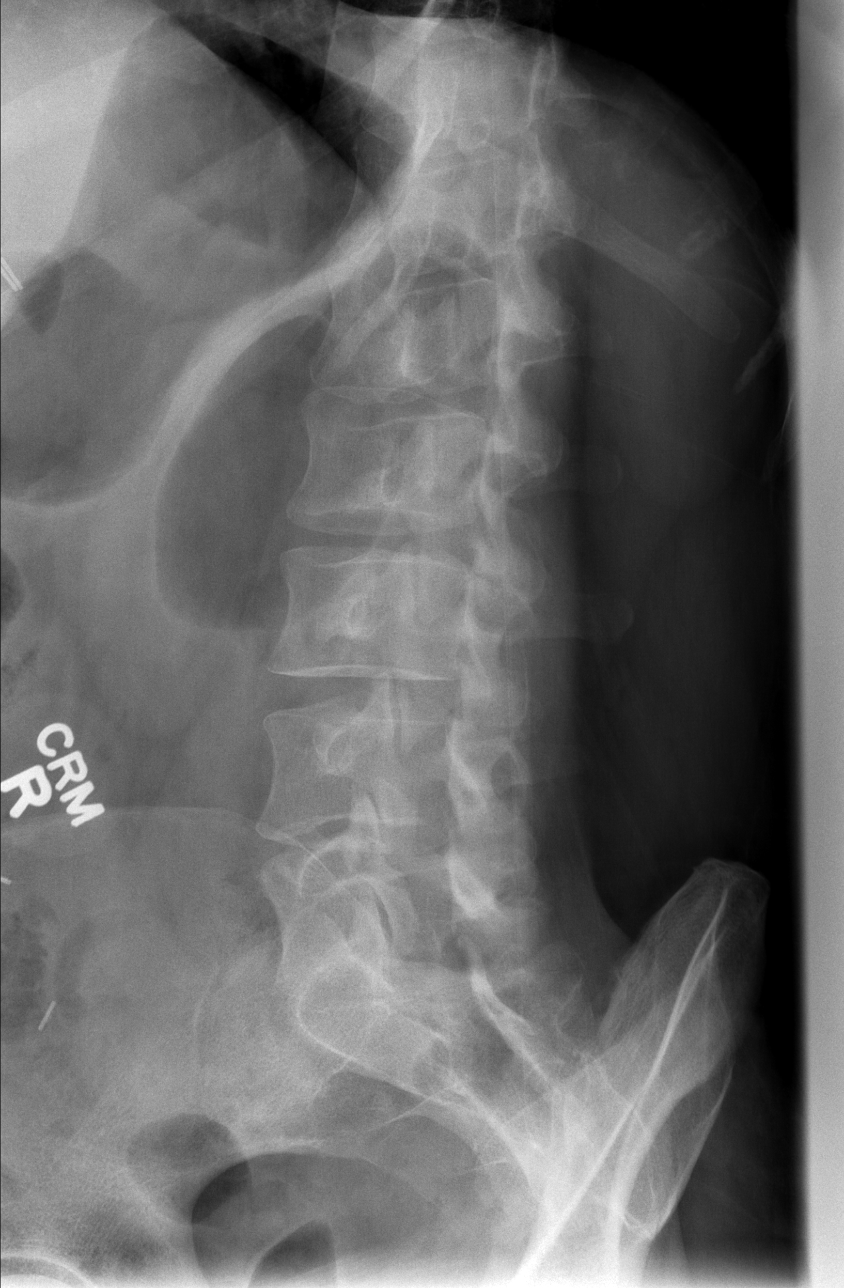

[t l-spine oblique exposure (2 of 2)]
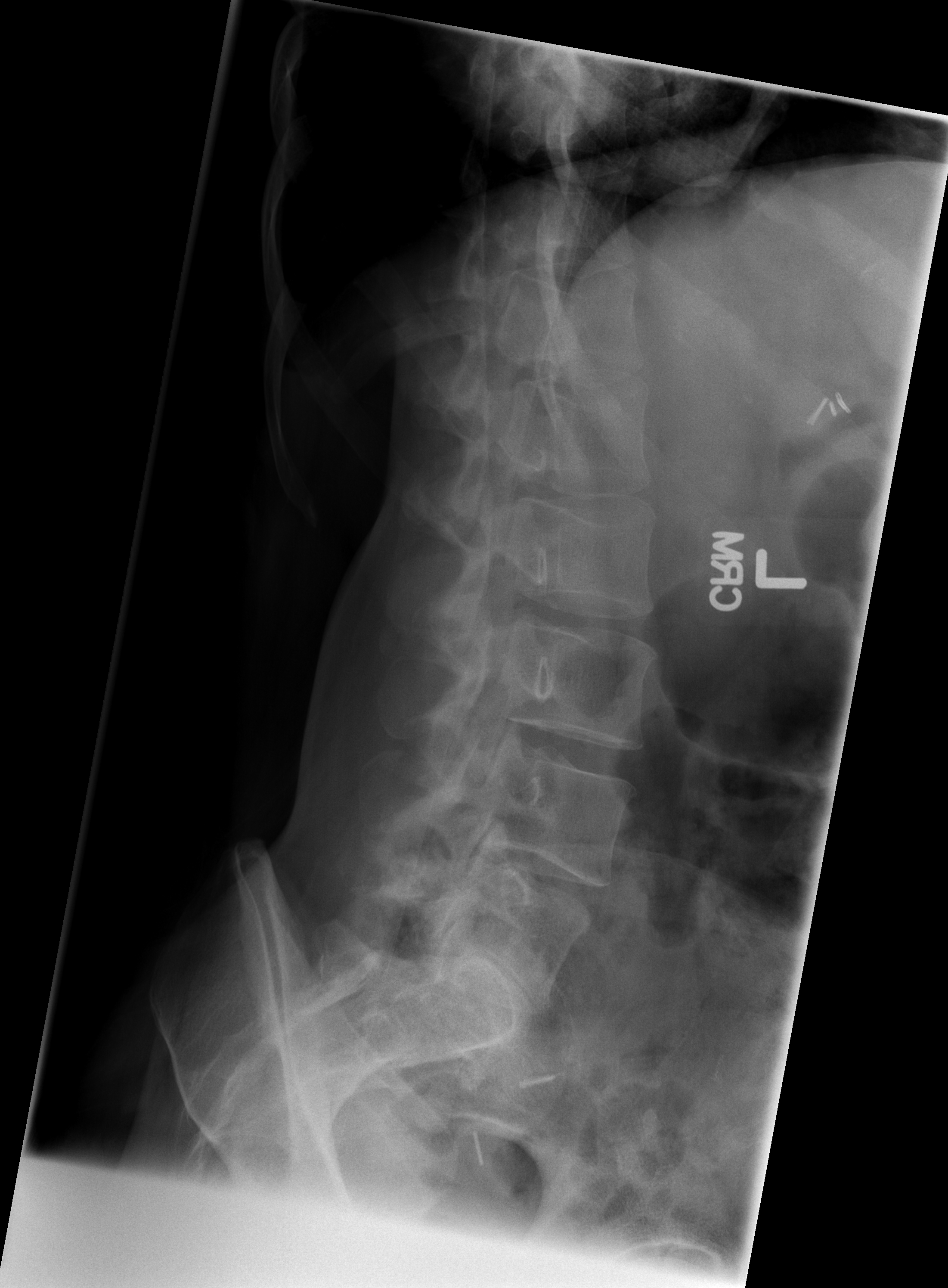

[t l-spine lat]
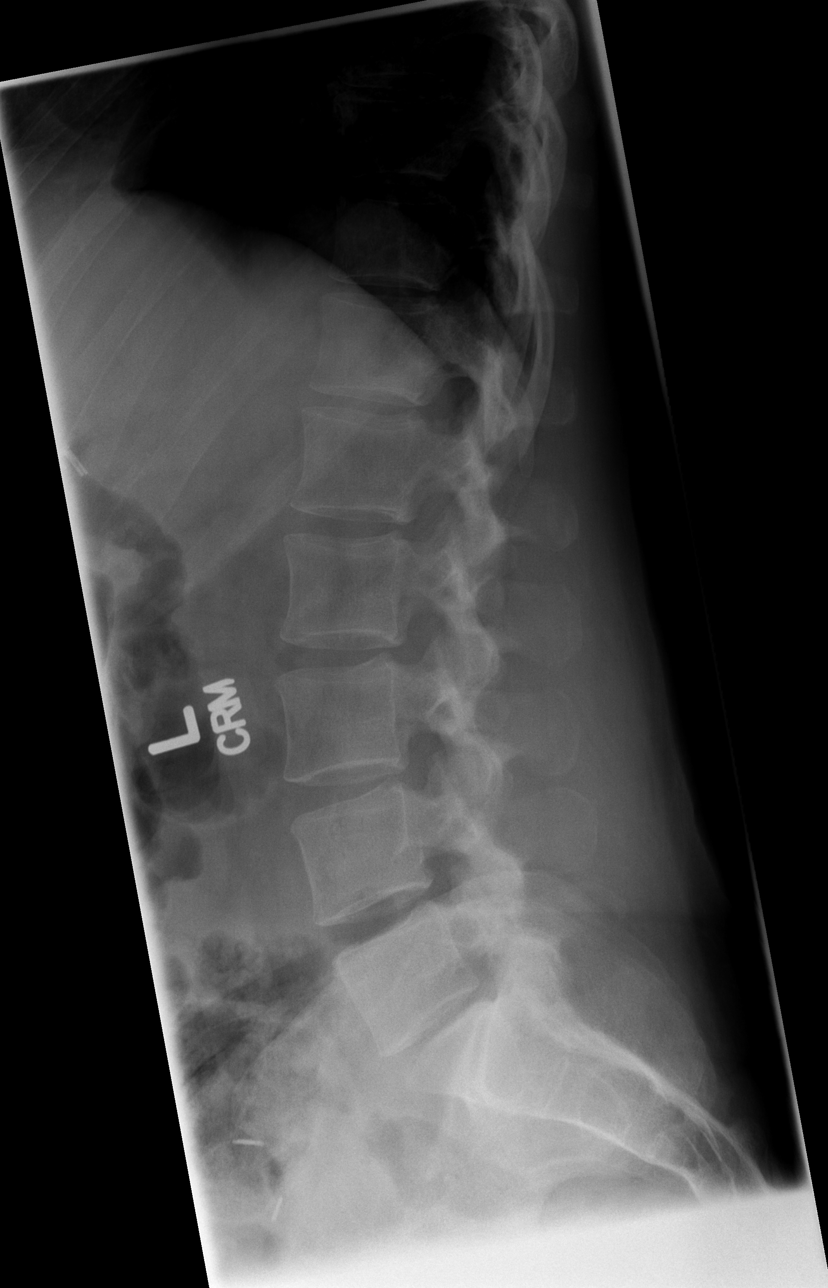

[t l-spine l5-s1 spot]
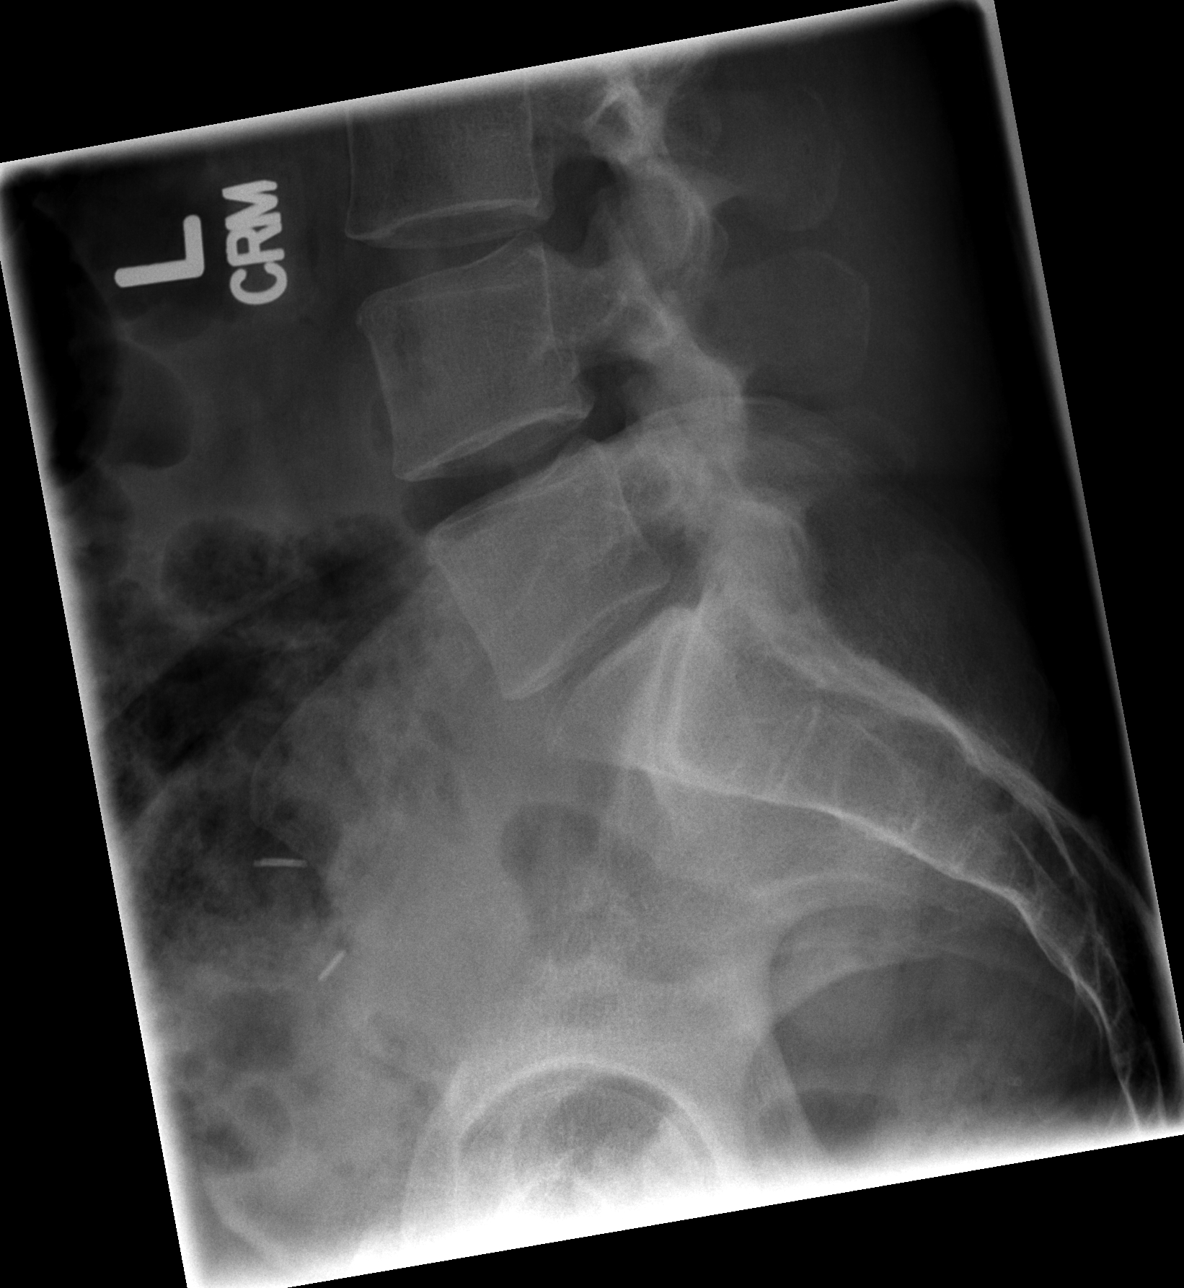

[5 of 5 positions shown; findings below may reference images not displayed]

FINDINGS: Five lumbar type vertebrae.  Normal alignment of the
lumbar vertebrae and facet joints.  Mild degenerative narrowing of
the L5-S1 interspace.  Intervertebral disc space heights are
otherwise preserved.  No vertebral compression deformities.  No
focal bone lesion or bone destruction.  Bone cortex and trabecular
architecture appear intact.  Surgical clips in the abdomen.
IMPRESSION: No displaced fractures demonstrated in the lumbar spine.

## 2014-04-11 ENCOUNTER — Encounter: Payer: Self-pay | Admitting: Emergency Medicine

## 2014-04-11 ENCOUNTER — Emergency Department
Admission: EM | Admit: 2014-04-11 | Discharge: 2014-04-11 | Disposition: A | Discharge status: Home / Self Care | Payer: Medicaid Other | Source: Home / Self Care | Attending: Emergency Medicine | Admitting: Emergency Medicine

## 2014-04-11 DIAGNOSIS — N390 Urinary tract infection, site not specified: Secondary | ICD-10-CM

## 2014-04-11 LAB — POCT URINALYSIS DIP (MANUAL ENTRY)
Glucose, UA: NEGATIVE
Ketones, POC UA: NEGATIVE
Nitrite, UA: NEGATIVE
Protein Ur, POC: 30
Spec Grav, UA: >=1.03 (ref 1.005–1.03)
Urobilinogen, UA: 1 (ref 0–1)
pH, UA: 5.5 (ref 5–8)

## 2014-04-11 MED ORDER — CEPHALEXIN 500 MG PO CAPS: 500.0000 mg | ORAL_CAPSULE | Freq: Four times a day (QID) | ORAL | Status: DC

## 2014-04-11 MED ORDER — HYDROCODONE-ACETAMINOPHEN 5-325 MG PO TABS: 2.0000 | ORAL_TABLET | ORAL | Status: DC | PRN

## 2014-04-11 NOTE — Discharge Instructions (Signed)

## 2014-04-11 NOTE — ED Provider Notes (Signed)
CSN: [id number]     Arrival date & time 04/11/14  1451 History   First MD Initiated Contact with Patient 04/11/14 1530     Chief Complaint  Patient presents with  . Dysuria  . Urinary Frequency   (Consider location/radiation/quality/duration/timing/severity/associated sxs/prior Treatment) Patient is a 49 y.o. female presenting with dysuria and frequency. The history is provided by the patient. No language interpreter was used.  Dysuria Pain quality:  Aching Pain severity:  Moderate Onset quality:  Gradual Duration:  3 days Timing:  Constant Progression:  Worsening Chronicity:  New Recent urinary tract infections: no   Relieved by:  Nothing Worsened by:  Nothing tried Ineffective treatments:  None tried Urinary symptoms: no frequent urination   Associated symptoms: no abdominal pain   Risk factors: no hx of pyelonephritis   Urinary Frequency Pertinent negatives include no abdominal pain.    Past Medical History  Diagnosis Date  . Asthma   . Chronic back pain    Past Surgical History  Procedure Laterality Date  . Back surgery    . Abdominal hysterectomy    . Insertion of mesh     Family History  Problem Relation Age of Onset  . Cancer Mother   . Cancer Father   . Diabetes Other   . Hypertension Other    History  Substance Use Topics  . Smoking status: Current Every Day Smoker -- 0.50 packs/day    Types: Cigarettes  . Smokeless tobacco: Not on file  . Alcohol Use: No   OB History    No data available     Review of Systems  Gastrointestinal: Negative for abdominal pain.  Genitourinary: Positive for dysuria and frequency.  All other systems reviewed and are negative.   Allergies  Compazine; Ketorolac tromethamine; and Morphine and related  Home Medications   Prior to Admission medications   Medication Sig Start Date End Date Taking? Authorizing Provider  albuterol (PROVENTIL HFA;VENTOLIN HFA) 108 (90 BASE) MCG/ACT inhaler Inhale 2 puffs into the lungs  every 6 (six) hours as needed for wheezing or shortness of breath.     Historical Provider, MD  ALPRAZolam Prudy Feeler) 1 MG tablet Take 1 mg by mouth 2 (two) times daily as needed for sleep or anxiety.    Historical Provider, MD  cephALEXin (KEFLEX) 500 MG capsule Take 1 capsule (500 mg total) by mouth 4 (four) times daily. 04/11/14   Elson Areas, PA-C  cyclobenzaprine (FLEXERIL) 10 MG tablet Take 1 tablet (10 mg total) by mouth 2 (two) times daily as needed for muscle spasms. 10/24/12   Enid Skeens, MD  HYDROcodone-acetaminophen (NORCO/VICODIN) 5-325 MG per tablet Take 2 tablets by mouth every 4 (four) hours as needed. 04/11/14   Elson Areas, PA-C  ipratropium-albuterol (DUONEB) 0.5-2.5 (3) MG/3ML SOLN Take 3 mLs by nebulization every 6 (six) hours as needed (SOB).     Historical Provider, MD  naproxen (NAPROSYN) 250 MG tablet Take 250 mg by mouth 4 (four) times daily as needed (pain).    Historical Provider, MD  predniSONE (DELTASONE) 20 MG tablet Take 1 tablet (20 mg total) by mouth 2 (two) times daily. 10/14/12   Arie Sabina Schinlever, PA-C  zolpidem (AMBIEN) 5 MG tablet Take 5 mg by mouth at bedtime as needed for sleep.    Historical Provider, MD   BP 115/60 mmHg  Pulse 60  Temp(Src) 97.9 F (36.6 C) (Oral)  Resp 16  Ht  (1.626 m)  Wt 150 lb (68.04 kg)  BMI 25.73 kg/m2  SpO2 97% Physical Exam  Constitutional: She is oriented to person, place, and time. She appears well-developed and well-nourished.  HENT:  Head: Normocephalic and atraumatic.  Right Ear: External ear normal.  Mouth/Throat: Oropharynx is clear and moist.  Eyes: EOM are normal. Pupils are equal, round, and reactive to light.  Neck: Normal range of motion.  Cardiovascular: Normal rate and normal heart sounds.   Pulmonary/Chest: Effort normal.  Abdominal: Soft. She exhibits no distension.  Musculoskeletal: Normal range of motion.  Neurological: She is alert and oriented to person, place, and time.  Skin: Skin is  warm.  Psychiatric: She has a normal mood and affect.  Nursing note and vitals reviewed.   ED Course  Procedures (including critical care time) Labs Review Labs Reviewed  URINE CULTURE  POCT URINALYSIS DIP (MANUAL ENTRY)    Imaging Review No results found.   MDM positive leukocytes, mod blood   1. Urinary tract infection without hematuria, site unspecified    Keflex Hydrocodone AVS See your Physicain for recheck in 1 week    Elson Areas, PA-C 04/11/14 1610

## 2014-04-11 NOTE — ED Notes (Signed)
Reports dysuria and frequency of urination x 3 days. Taking tylenol. Does not have frequent UTIs.

## 2014-04-13 ENCOUNTER — Telehealth: Payer: Self-pay | Admitting: *Deleted

## 2014-04-13 LAB — URINE CULTURE: Colony Count: 45000

## 2014-04-27 ENCOUNTER — Encounter: Payer: Self-pay | Admitting: *Deleted

## 2014-04-27 ENCOUNTER — Emergency Department (INDEPENDENT_AMBULATORY_CARE_PROVIDER_SITE_OTHER)
Admission: EM | Admit: 2014-04-27 | Discharge: 2014-04-27 | Disposition: A | Discharge status: Home / Self Care | Payer: Self-pay | Source: Home / Self Care | Attending: Family Medicine | Admitting: Family Medicine

## 2014-04-27 DIAGNOSIS — F410 Panic disorder [episodic paroxysmal anxiety] without agoraphobia: Secondary | ICD-10-CM

## 2014-04-27 DIAGNOSIS — F411 Generalized anxiety disorder: Secondary | ICD-10-CM

## 2014-04-27 MED ORDER — HYDROXYZINE HCL 25 MG PO TABS: ORAL_TABLET | ORAL | Status: DC

## 2014-04-27 NOTE — Discharge Instructions (Signed)
If symptoms become significantly worse during the night or over the weekend, proceed to the local emergency room.    Panic Attacks Panic attacks are sudden, short-livedsurges of severe anxiety, fear, or discomfort. They may occur for no reason when you are relaxed, when you are anxious, or when you are sleeping. Panic attacks may occur for a number of reasons:   Healthy people occasionally have panic attacks in extreme, life-threatening situations, such as war or natural disasters. Normal anxiety is a protective mechanism of the body that helps us react to danger (fight or flight response).  Panic attacks are often seen with anxiety disorders, such as panic disorder, social anxiety disorder, generalized anxiety disorder, and phobias. Anxiety disorders cause excessive or uncontrollable anxiety. They may interfere with your relationships or other life activities.  Panic attacks are sometimes seen with other mental illnesses, such as depression and posttraumatic stress disorder.  Certain medical conditions, prescription medicines, and drugs of abuse can cause panic attacks. SYMPTOMS  Panic attacks start suddenly, peak within 20 minutes, and are accompanied by four or more of the following symptoms:  Pounding heart or fast heart rate (palpitations).  Sweating.  Trembling or shaking.  Shortness of breath or feeling smothered.  Feeling choked.  Chest pain or discomfort.  Nausea or strange feeling in your stomach.  Dizziness, light-headedness, or feeling like you will faint.  Chills or hot flushes.  Numbness or tingling in your lips or hands and feet.  Feeling that things are not real or feeling that you are not yourself.  Fear of losing control or going crazy.  Fear of dying. Some of these symptoms can mimic serious medical conditions. For example, you may think you are having a heart attack. Although panic attacks can be very scary, they are not life threatening. DIAGNOSIS   Panic attacks are diagnosed through an assessment by your health care provider. Your health care provider will ask questions about your symptoms, such as where and when they occurred. Your health care provider will also ask about your medical history and use of alcohol and drugs, including prescription medicines. Your health care provider may order blood tests or other studies to rule out a serious medical condition. Your health care provider may refer you to a mental health professional for further evaluation. TREATMENT   Most healthy people who have one or two panic attacks in an extreme, life-threatening situation will not require treatment.  The treatment for panic attacks associated with anxiety disorders or other mental illness typically involves counseling with a mental health professional, medicine, or a combination of both. Your health care provider will help determine what treatment is best for you.  Panic attacks due to physical illness usually go away with treatment of the illness. If prescription medicine is causing panic attacks, talk with your health care provider about stopping the medicine, decreasing the dose, or substituting another medicine.  Panic attacks due to alcohol or drug abuse go away with abstinence. Some adults need professional help in order to stop drinking or using drugs. HOME CARE INSTRUCTIONS   Take all medicines as directed by your health care provider.   Schedule and attend follow-up visits as directed by your health care provider. It is important to keep all your appointments. SEEK MEDICAL CARE IF:  You are not able to take your medicines as prescribed.  Your symptoms do not improve or get worse. SEEK IMMEDIATE MEDICAL CARE IF:   You experience panic attack symptoms that are different than your  usual symptoms.  You have serious thoughts about hurting yourself or others.  You are taking medicine for panic attacks and have a serious side effect. MAKE  SURE YOU:  Understand these instructions.  Will watch your condition.  Will get help right away if you are not doing well or get worse. Document Released: 03/25/2005 Document Revised: 03/30/2013 Document Reviewed: 11/06/2012 [facility] Patient Information 2015 [place], [place]. This information is not intended to replace advice given to you by your health care provider. Make sure you discuss any questions you have with your health care provider.

## 2014-04-27 NOTE — ED Notes (Signed)
Reviewed meds with pt she reports that she is not taking her Lorazepam that was filled 2 days ago per the [place] controlled substance registry or her Ambien that was filled on 04/04/14.

## 2014-04-27 NOTE — ED Provider Notes (Signed)
CSN: [id number]     Arrival date & time 04/27/14  1009 History   First MD Initiated Contact with Patient 04/27/14 1100     Chief Complaint  Patient presents with  . Anxiety      HPI Comments: Patient complains of recent increase in anxiety and panic attacks.  She feels anxious and panicky when in crowds.  Yesterday evening she had a panic attack with a sensation of tightness in her chest and shortness of breath, and she felt frightened.  Her last panic attack was about a year ago.  She is under increased stress.  She states that her father died of cancer one week ago.  She and her child had been living with him and must now move out of his house.  She states that she has been crying more frequently but denies being depressed. She has lorazepam but states that she has not been taking it.  Patient is a 49 y.o. female presenting with anxiety. The history is provided by the patient.  Anxiety This is a chronic problem. The current episode started more than 1 week ago. The problem occurs daily. The problem has been gradually worsening. Associated symptoms include chest pain and shortness of breath. Exacerbated by: crowded places. Nothing relieves the symptoms. Treatments tried: lorazepam. The treatment provided mild relief.    Past Medical History  Diagnosis Date  . Asthma   . Chronic back pain    Past Surgical History  Procedure Laterality Date  . Back surgery    . Abdominal hysterectomy    . Insertion of mesh     Family History  Problem Relation Age of Onset  . Cancer Mother   . Cancer Father   . Diabetes Other   . Hypertension Other    History  Substance Use Topics  . Smoking status: Current Every Day Smoker -- 0.50 packs/day    Types: Cigarettes  . Smokeless tobacco: Not on file  . Alcohol Use: No   OB History    No data available     Review of Systems  Respiratory: Positive for shortness of breath.   Cardiovascular: Positive for chest pain.  Psychiatric/Behavioral:  Positive for sleep disturbance and decreased concentration. Negative for suicidal ideas and self-injury. The patient is nervous/anxious.     Allergies  Compazine; Ketorolac tromethamine; and Morphine and related  Home Medications   Prior to Admission medications   Medication Sig Start Date End Date Taking? Authorizing Provider  albuterol (PROVENTIL HFA;VENTOLIN HFA) 108 (90 BASE) MCG/ACT inhaler Inhale 2 puffs into the lungs every 6 (six) hours as needed for wheezing or shortness of breath.     Historical Provider, MD  ALPRAZolam Prudy Feeler(XANAX) 1 MG tablet Take 1 mg by mouth 2 (two) times daily as needed for sleep or anxiety.    Historical Provider, MD  cephALEXin (KEFLEX) 500 MG capsule Take 1 capsule (500 mg total) by mouth 4 (four) times daily. 04/11/14   Elson AreasLeslie K Sofia, PA-C  cyclobenzaprine (FLEXERIL) 10 MG tablet Take 1 tablet (10 mg total) by mouth 2 (two) times daily as needed for muscle spasms. 10/24/12   Enid SkeensJoshua M Zavitz, MD  HYDROcodone-acetaminophen (NORCO/VICODIN) 5-325 MG per tablet Take 2 tablets by mouth every 4 (four) hours as needed. 04/11/14   Elson AreasLeslie K Sofia, PA-C  hydrOXYzine (ATARAX/VISTARIL) 25 MG tablet Take one or two tabs by mouth every 6 hours as needed for anxiety 04/27/14   Lattie HawStephen A Beese, MD  ipratropium-albuterol (DUONEB) 0.5-2.5 (3) MG/3ML SOLN Take  3 mLs by nebulization every 6 (six) hours as needed (SOB).     Historical Provider, MD  naproxen (NAPROSYN) 250 MG tablet Take 250 mg by mouth 4 (four) times daily as needed (pain).    Historical Provider, MD  predniSONE (DELTASONE) 20 MG tablet Take 1 tablet (20 mg total) by mouth 2 (two) times daily. 10/14/12   Arie Sabina Schinlever, PA-C  zolpidem (AMBIEN) 5 MG tablet Take 5 mg by mouth at bedtime as needed for sleep.    Historical Provider, MD   BP 138/87 mmHg  Pulse 94  Temp(Src) 98.1 F (36.7 C) (Oral)  Resp 16  Ht  (1.651 m)  Wt 162 lb (73.483 kg)  BMI 26.96 kg/m2  SpO2 99% Physical Exam Nursing notes and Vital  Signs reviewed. Appearance:  Patient appears stated age, and in no acute distress. Psychiatric:  Patient is alert and oriented with poor eye contact.  Thoughts are organized.  No psychomotor retardation.  Memory intact.  Affect is flat, and mood somewhat depressed.  Not suicidal  Eyes:  Pupils are equal, round, and reactive to light and accomodation.  Extraocular movement is intact.  Conjunctivae are not inflamed  Pharynx:  Normal Neck:  Supple.  No adenopathy Lungs:  Clear to auscultation.  Breath sounds are equal.  Heart:  Regular rate and rhythm without murmurs, rubs, or gallops.  Extremities:  No edema.      ED Course  Procedures  none      MDM   1. Anxiety state   2. Panic attacks    Begin trial of Vistaril , one or two Q6hr prn anxiety.  #20, no refill. If symptoms become significantly worse during the night or over the weekend, proceed to the local emergency room. Followup with Family Doctor in 3 to 6 days.    Lattie Haw, MD 04/28/14 1028

## 2014-04-27 NOTE — ED Notes (Signed)
Pt c/o increased anxiety x last night. "things are closing in", "scared feeling", and "crying a lot". She reports her father passed away last week. Denies suicidal ideation.

## 2014-05-13 ENCOUNTER — Encounter (INDEPENDENT_AMBULATORY_CARE_PROVIDER_SITE_OTHER): Payer: Self-pay

## 2014-05-13 ENCOUNTER — Ambulatory Visit (INDEPENDENT_AMBULATORY_CARE_PROVIDER_SITE_OTHER): Payer: Self-pay | Admitting: Physician Assistant

## 2014-05-13 ENCOUNTER — Encounter (HOSPITAL_COMMUNITY): Payer: Self-pay | Admitting: Physician Assistant

## 2014-05-13 VITALS — BP 107/75 | HR 71 | Ht 65.5 in | Wt 160.0 lb

## 2014-05-13 DIAGNOSIS — F1320 Sedative, hypnotic or anxiolytic dependence, uncomplicated: Secondary | ICD-10-CM

## 2014-05-13 DIAGNOSIS — F1120 Opioid dependence, uncomplicated: Secondary | ICD-10-CM

## 2014-05-13 NOTE — Progress Notes (Signed)
Patient ID: Deanna Marshall, female   DOB: 02/01/1966, 49 y.o.   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 She is not interested in stopping her benzodiazepines which she continues to get from multiple sources.  I am informed [facility] not doing in patient opiate/benzodiazepine detox without a psychosocial component. The patient does not want to be in patient.  She is given multiple lists of out patient Substance abuse Facilities [place] [facility]. She wants to use Suboxone or Sebutex, neither of which I am certified in using.  The patient left the office without any specific plans other than saying she can not "do this."Lloyd HugerNeil T. Sam Overbeck RPAC 10:29 AM 05/13/2014

## 2014-05-14 ENCOUNTER — Emergency Department (INDEPENDENT_AMBULATORY_CARE_PROVIDER_SITE_OTHER)
Admission: EM | Admit: 2014-05-14 | Discharge: 2014-05-14 | Disposition: A | Discharge status: Home / Self Care | Payer: Medicaid Other | Source: Home / Self Care | Attending: Family Medicine | Admitting: Family Medicine

## 2014-05-14 DIAGNOSIS — F1120 Opioid dependence, uncomplicated: Secondary | ICD-10-CM

## 2014-05-14 DIAGNOSIS — R109 Unspecified abdominal pain: Secondary | ICD-10-CM

## 2014-05-14 LAB — POCT URINALYSIS DIP (MANUAL ENTRY)
Bilirubin, UA: NEGATIVE
Glucose, UA: NEGATIVE
Ketones, POC UA: NEGATIVE
Leukocytes, UA: NEGATIVE
Nitrite, UA: NEGATIVE
Spec Grav, UA: 1.025 (ref 1.005–1.03)
Urobilinogen, UA: 1 (ref 0–1)
pH, UA: 7 (ref 5–8)

## 2014-05-14 NOTE — ED Provider Notes (Signed)
Deanna Marshall is a 49 y.o. female who presents to Urgent Care today for right flank pain. Patient was in her normal state of health until yesterday when she developed severe onset of right flank pain radiating to the groin. Pain is worse with urination. Patient notes no prior history of kidney stones. However her stories [place] aided by opiate dependence. Patient last took opiates about 2 days ago. She's developed sweating and subjective fevers and chills. She denies any nausea or vomiting.   Past Medical History  Diagnosis Date  . Asthma   . Chronic back pain    Past Surgical History  Procedure Laterality Date  . Back surgery    . Abdominal hysterectomy    . Insertion of mesh     History  Substance Use Topics  . Smoking status: Current Every Day Smoker -- 0.50 packs/day    Types: Cigarettes  . Smokeless tobacco: Never Used  . Alcohol Use: No   ROS as above Medications: No current facility-administered medications for this encounter.   Current Outpatient Prescriptions  Medication Sig Dispense Refill  . albuterol (PROVENTIL HFA;VENTOLIN HFA) 108 (90 BASE) MCG/ACT inhaler Inhale 2 puffs into the lungs every 6 (six) hours as needed for wheezing or shortness of breath.     . ALPRAZolam (XANAX) 1 MG tablet Take 1 mg by mouth 2 (two) times daily as needed for sleep or anxiety.    . cephALEXin (KEFLEX) 500 MG capsule Take 1 capsule (500 mg total) by mouth 4 (four) times daily. 28 capsule 0  . cyclobenzaprine (FLEXERIL) 10 MG tablet Take 1 tablet (10 mg total) by mouth 2 (two) times daily as needed for muscle spasms. 10 tablet 0  . HYDROcodone-acetaminophen (NORCO/VICODIN) 5-325 MG per tablet Take 2 tablets by mouth every 4 (four) hours as needed. 10 tablet 0  . hydrOXYzine (ATARAX/VISTARIL) 25 MG tablet Take one or two tabs by mouth every 6 hours as needed for anxiety 20 tablet 0  . ipratropium-albuterol (DUONEB) 0.5-2.5 (3) MG/3ML SOLN Take 3 mLs by nebulization every 6 (six) hours as needed  (SOB).     . naproxen (NAPROSYN) 250 MG tablet Take 250 mg by mouth 4 (four) times daily as needed (pain).    Marland Kitchen zolpidem (AMBIEN) 5 MG tablet Take 5 mg by mouth at bedtime as needed for sleep.     Allergies  Allergen Reactions  . Compazine [Prochlorperazine] Hives  . Ketorolac Tromethamine Swelling  . Morphine And Related Hives     Exam:  BP 127/84 mmHg  Pulse 83  Temp(Src) 98.2 F (36.8 C) (Oral)  Ht  (1.651 m)  Wt 149 lb (67.586 kg)  BMI 24.79 kg/m2  SpO2 98% Gen: Well NAD HEENT: EOMI,  MMM Lungs: Normal work of breathing. CTABL Heart: RRR no MRG Abd: NABS, Soft. Nondistended, Nontender no CV angle tenderness to percussion Exts: Brisk capillary refill, warm and well perfused.   Urinalysis significant for trace blood with absent leukocyte esterase and nitrates  No results found for this or any previous visit (from the past 24 hour(s)). No results found.  Assessment and Plan: 49 y.o. female with flank pain. I'm unable to determine if patient has a new kidney stone or if this is simply opiate withdrawal. I do not have access to a CT scan in this location. Plan to discharge to emergency room for further evaluation and management.  Discussed warning signs or symptoms. Please see discharge instructions. Patient expresses understanding.     Rodolph Bong,  MD 05/14/14 1727

## 2014-05-14 NOTE — Discharge Instructions (Signed)
[facility]

## 2014-05-14 NOTE — ED Notes (Signed)
Patient c/o lower back pain when urinating x 1 day

## 2015-01-04 ENCOUNTER — Emergency Department (HOSPITAL_COMMUNITY)
Admission: EM | Admit: 2015-01-04 | Discharge: 2015-01-04 | Discharge status: Against Medical Advice | Payer: Medicare Other | Attending: Emergency Medicine | Admitting: Emergency Medicine

## 2015-01-04 DIAGNOSIS — R112 Nausea with vomiting, unspecified: Secondary | ICD-10-CM | POA: Insufficient documentation

## 2015-01-04 DIAGNOSIS — G4700 Insomnia, unspecified: Secondary | ICD-10-CM

## 2015-01-04 DIAGNOSIS — Z79899 Other long term (current) drug therapy: Secondary | ICD-10-CM | POA: Insufficient documentation

## 2015-01-04 DIAGNOSIS — R51 Headache: Secondary | ICD-10-CM

## 2015-01-04 DIAGNOSIS — G8929 Other chronic pain: Secondary | ICD-10-CM | POA: Insufficient documentation

## 2015-01-04 DIAGNOSIS — J45909 Unspecified asthma, uncomplicated: Secondary | ICD-10-CM | POA: Diagnosis not present

## 2015-01-04 DIAGNOSIS — R519 Headache, unspecified: Secondary | ICD-10-CM

## 2015-01-04 MED ORDER — SODIUM CHLORIDE 0.9 % IV BOLUS (SEPSIS)
500.0000 mL | Freq: Once | INTRAVENOUS | Status: AC
  Administered 2015-01-04: 500 mL via INTRAVENOUS

## 2015-01-04 MED ORDER — METOCLOPRAMIDE HCL 5 MG/ML IJ SOLN
10.0000 mg | Freq: Once | INTRAMUSCULAR | Status: AC
  Administered 2015-01-04: 10 mg via INTRAVENOUS
  Filled 2015-01-04: qty 2

## 2015-01-04 MED ORDER — DEXAMETHASONE SODIUM PHOSPHATE 10 MG/ML IJ SOLN
10.0000 mg | Freq: Once | INTRAMUSCULAR | Status: AC
  Administered 2015-01-04: 10 mg via INTRAVENOUS
  Filled 2015-01-04: qty 1

## 2015-01-04 MED ORDER — DIPHENHYDRAMINE HCL 50 MG/ML IJ SOLN
12.5000 mg | Freq: Once | INTRAMUSCULAR | Status: AC
  Administered 2015-01-04: 12.5 mg via INTRAVENOUS
  Filled 2015-01-04: qty 1

## 2015-01-04 NOTE — ED Notes (Signed)
Pt states that she has a hx of migraines and has had a headache since Sunday and hasn't been able to sleep because of it. N/V. Alert and oriented.

## 2015-01-04 NOTE — ED Provider Notes (Signed)
CSN: [id number]     Arrival date & time 01/04/15  0120 History  By signing my name below, I, Evon Slack, attest that this documentation has been prepared under the direction and in the presence of [employer], PA-C. Electronically Signed: Evon Slack, ED Scribe. 01/04/2015. 2:02 AM.     Chief Complaint  Patient presents with  . Headache   The history is provided by the patient. No language interpreter was used.   HPI Comments: Deanna Marshall is a 49 y.o. female who presents to the Emergency Department complaining of constant gradaul temporal HA onset 2 days prior. Pt describes the HA as throbbing. Pt state that she has not been able to sleep since the onset of her HA. Pt states that she has tried Vicodin with no relief. Pt reports nausea and vomiting x2 yesterday as well. Pt states she is also worried about an upcoming back surgery. Pt states she does have a Hx of migraine HA but states that this HA is not similar to previous HA's. Pt doesn't report any alleviating or worsening symptoms. Denies diarrhea, fever, cough, SOB or other related symptoms. Pt denies any recent falls or head injuries. Pt reports Hx of insomnia several years prior.   Past Medical History  Diagnosis Date  . Asthma   . Chronic back pain    Past Surgical History  Procedure Laterality Date  . Back surgery    . Abdominal hysterectomy    . Insertion of mesh     Family History  Problem Relation Age of Onset  . Cancer Mother   . Cancer Father   . Diabetes Other   . Hypertension Other    Social History  Substance Use Topics  . Smoking status: Current Every Day Smoker -- 0.50 packs/day    Types: Cigarettes  . Smokeless tobacco: Never Used  . Alcohol Use: No   OB History    No data available     Review of Systems  Constitutional: Negative for fever.  Respiratory: Negative for cough.   Gastrointestinal: Positive for nausea and vomiting. Negative for diarrhea.  Neurological: Positive for headaches.   Psychiatric/Behavioral: Positive for sleep disturbance.  All other systems reviewed and are negative.    Allergies  Compazine; Ketorolac tromethamine; and Morphine and related  Home Medications   Prior to Admission medications   Medication Sig Start Date End Date Taking? Authorizing Provider  ALPRAZolam Prudy Feeler) 1 MG tablet Take 1 mg by mouth 2 (two) times daily as needed for sleep or anxiety.    Historical Provider, MD  cephALEXin (KEFLEX) 500 MG capsule Take 1 capsule (500 mg total) by mouth 4 (four) times daily. 04/11/14   Elson Areas, PA-C  cyclobenzaprine (FLEXERIL) 10 MG tablet Take 1 tablet (10 mg total) by mouth 2 (two) times daily as needed for muscle spasms. 10/24/12   Blane Ohara, MD  HYDROcodone-acetaminophen (NORCO/VICODIN) 5-325 MG per tablet Take 2 tablets by mouth every 4 (four) hours as needed. 04/11/14   Elson Areas, PA-C  hydrOXYzine (ATARAX/VISTARIL) 25 MG tablet Take one or two tabs by mouth every 6 hours as needed for anxiety 04/27/14   Lattie Haw, MD  ipratropium-albuterol (DUONEB) 0.5-2.5 (3) MG/3ML SOLN Take 3 mLs by nebulization every 6 (six) hours as needed (SOB).     Historical Provider, MD  naproxen (NAPROSYN) 250 MG tablet Take 250 mg by mouth 4 (four) times daily as needed (pain).    Historical Provider, MD  zolpidem (AMBIEN) 5 MG tablet  Take 5 mg by mouth at bedtime as needed for sleep.    Historical Provider, MD   BP 120/62 mmHg  Pulse 77  Temp(Src) 97.5 F (36.4 C) (Oral)  Resp 18  SpO2 100%   Physical Exam  Constitutional: She is oriented to person, place, and time. She appears well-developed and well-nourished. No distress.  HENT:  Head: Normocephalic and atraumatic.  Eyes: Conjunctivae and EOM are normal.  Neck: Neck supple. No tracheal deviation present.  Cardiovascular: Normal rate.   Pulmonary/Chest: Effort normal. No respiratory distress.  Abdominal: There is no tenderness. There is no rebound.  Musculoskeletal: Normal range of  motion.  Neurological: She is alert and oriented to person, place, and time.  CN's 3-12 grossly intact. Speech is clear and focused. Coordination wnl. Ambulatory without imbalance.  Skin: Skin is warm and dry.  Psychiatric: She has a normal mood and affect. Her behavior is normal.  Nursing note and vitals reviewed.   ED Course  Procedures (including critical care time) DIAGNOSTIC STUDIES: Oxygen Saturation is 100% on RA, normal by my interpretation.    COORDINATION OF CARE: 2:00 AM-Discussed treatment plan with pt at bedside and pt agreed to plan.    Labs Review Labs Reviewed - No data to display  Imaging Review No results found.    EKG Interpretation None      MDM   Final diagnoses:  None   1. Headache 2. Insomnia  The patient was given IV medications for headache, and discussed sedating affect of the medications that would help with sleep. The patient received the medicines and immediately became upset that she could not sleep. She requested discharge.   Re-evaluation: the patient had pulled out her own IV when i came to speak to her about her care. She did not want to discuss staying, trying anything else, or other ways we could possibly help with her condition. She appears oriented, VSS. The patient left the department AMA.   I personally performed the services described in this documentation, which was scribed in my presence. The recorded information has been reviewed and is accurate.        Elpidio Anis, PA-C 01/04/15 9528  Loren Racer, MD 01/05/15 [phone]

## 2015-01-04 NOTE — ED Notes (Signed)
Pt jerked her own IV out,  And was standing on side of bed dressed.

## 2015-01-04 NOTE — ED Notes (Signed)
Pt said medication didn't work and she wanted something else  Pt refused to stay., left against medical advice and refused to sign  , Melvenia Beam PA aware

## 2015-01-04 NOTE — ED Notes (Signed)
When assessing pt for headache pain , she states more than anything she just wants to sleep and has ask this Clinical research associate continually what else can she be given to sleep ,  I ask what she had in mind she said she just wants to sleep , i told her we were trying to take care of her headace

## 2015-01-11 DIAGNOSIS — R51 Headache: Secondary | ICD-10-CM

## 2015-01-11 DIAGNOSIS — G4700 Insomnia, unspecified: Secondary | ICD-10-CM | POA: Insufficient documentation

## 2015-01-11 DIAGNOSIS — R519 Headache, unspecified: Secondary | ICD-10-CM | POA: Insufficient documentation

## 2015-01-11 DIAGNOSIS — Z87898 Personal history of other specified conditions: Secondary | ICD-10-CM | POA: Insufficient documentation

## 2015-01-11 DIAGNOSIS — F1191 Opioid use, unspecified, in remission: Secondary | ICD-10-CM | POA: Insufficient documentation

## 2015-01-25 ENCOUNTER — Telehealth (HOSPITAL_COMMUNITY): Payer: Self-pay | Admitting: *Deleted

## 2015-01-25 NOTE — Telephone Encounter (Signed)
Deanna Marshall called from [facility] and wanted to make Dr. Gilmore LarocheAkhtar aware that the pt got admitted today. If you have any questions Dr. Gilmore LarocheAkhtar can contact [place] at [phone]. Thanks

## 2015-01-26 NOTE — Telephone Encounter (Signed)
Thank you :)

## 2015-02-09 ENCOUNTER — Ambulatory Visit (HOSPITAL_COMMUNITY): Payer: Self-pay | Admitting: Psychiatry

## 2015-02-14 ENCOUNTER — Encounter (HOSPITAL_COMMUNITY): Payer: Self-pay | Admitting: Psychiatry

## 2015-02-14 ENCOUNTER — Ambulatory Visit (INDEPENDENT_AMBULATORY_CARE_PROVIDER_SITE_OTHER): Payer: Medicare Other | Admitting: Psychiatry

## 2015-02-14 VITALS — BP 130/80 | HR 105 | Ht 65.0 in | Wt 154.0 lb

## 2015-02-14 DIAGNOSIS — G4700 Insomnia, unspecified: Secondary | ICD-10-CM

## 2015-02-14 DIAGNOSIS — F0630 Mood disorder due to known physiological condition, unspecified: Secondary | ICD-10-CM

## 2015-02-14 DIAGNOSIS — F1320 Sedative, hypnotic or anxiolytic dependence, uncomplicated: Secondary | ICD-10-CM

## 2015-02-14 DIAGNOSIS — F4310 Post-traumatic stress disorder, unspecified: Secondary | ICD-10-CM | POA: Diagnosis not present

## 2015-02-14 MED ORDER — ESCITALOPRAM OXALATE 10 MG PO TABS: 10.0000 mg | ORAL_TABLET | Freq: Every day | ORAL | Status: DC

## 2015-02-14 MED ORDER — TRAZODONE HCL 50 MG PO TABS: 50.0000 mg | ORAL_TABLET | Freq: Every day | ORAL | Status: DC

## 2015-02-14 MED ORDER — QUETIAPINE FUMARATE 100 MG PO TABS: 100.0000 mg | ORAL_TABLET | Freq: Every day | ORAL | Status: DC
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
## 2015-02-20 NOTE — Progress Notes (Signed)
This encounter was created in error - please disregard.

## 2015-02-21 ENCOUNTER — Ambulatory Visit: Payer: Self-pay | Admitting: Family Medicine

## 2015-02-28 ENCOUNTER — Ambulatory Visit (INDEPENDENT_AMBULATORY_CARE_PROVIDER_SITE_OTHER): Payer: Medicare Other | Admitting: Emergency Medicine

## 2015-02-28 VITALS — BP 112/74 | HR 87 | Temp 97.8°F | Resp 16 | Ht 65.0 in | Wt 153.8 lb

## 2015-02-28 DIAGNOSIS — F1320 Sedative, hypnotic or anxiolytic dependence, uncomplicated: Secondary | ICD-10-CM

## 2015-02-28 DIAGNOSIS — F410 Panic disorder [episodic paroxysmal anxiety] without agoraphobia: Secondary | ICD-10-CM

## 2015-02-28 DIAGNOSIS — F1120 Opioid dependence, uncomplicated: Secondary | ICD-10-CM | POA: Diagnosis not present

## 2015-02-28 DIAGNOSIS — F32A Depression, unspecified: Secondary | ICD-10-CM

## 2015-02-28 DIAGNOSIS — F329 Major depressive disorder, single episode, unspecified: Secondary | ICD-10-CM | POA: Diagnosis not present

## 2015-02-28 MED ORDER — ALPRAZOLAM 0.5 MG PO TABS: 0.5000 mg | ORAL_TABLET | Freq: Three times a day (TID) | ORAL | Status: DC | PRN

## 2015-02-28 MED ORDER — PAROXETINE HCL 20 MG PO TABS: 20.0000 mg | ORAL_TABLET | Freq: Every day | ORAL | Status: DC

## 2015-02-28 NOTE — Progress Notes (Signed)
Subjective:  Patient ID: Deanna Marshall, female    DOB: 01/27/1966  Age: 49 y.o. MRN: [id number]  CC: Anxiety   HPI Deanna GlassingDawn Palacios presents  requesting help with anxiety and insomnia. she has been under the care of a psychiatrist and treated with Xanax and a number of different sleep medications. She was given a prescription for Xanax which should've lasted her another 6 days. She is exhausted that medication. She thinks it because she is "moving." And it may have gotten lost. She has a history of benzodiazepine and an opioid abuse. She has a lengthy story of problems in her life including the death of her husband in a suicide. Her father just died she's moving out of his house. He died after lengthy illness or she was primary caregiver. She says she's not sleeping at all and is having a great deal of difficulty with anxiety panic disorder she has an appointment she says in 2 weeks with someone at behavioral health and is fired the psychiatrist she was seeing and has treated her with medication. She has no suicidal thoughts or thoughts of harm to others  History Anzleigh has a past medical history of Asthma; Chronic back pain; Anxiety; and COPD (chronic obstructive pulmonary disease) (HCC).   She has past surgical history that includes Back surgery; Abdominal hysterectomy; and Insertion of mesh.   Her  family history includes Cancer in her father and mother; Diabetes in her mother and other; Hypertension in her mother and other; Mental illness in her mother.  She   reports that she has been smoking Cigarettes.  She has been smoking about 0.50 packs per day. She has never used smokeless tobacco. She reports that she does not drink alcohol or use illicit drugs.  Outpatient Prescriptions Prior to Visit  Medication Sig Dispense Refill  . ipratropium-albuterol (DUONEB) 0.5-2.5 (3) MG/3ML SOLN Take 3 mLs by nebulization every 6 (six) hours as needed (SOB).     Marland Kitchen. ALPRAZolam (XANAX) 1 MG tablet Take 1 mg by  mouth 2 (two) times daily as needed for sleep or anxiety.    . cephALEXin (KEFLEX) 500 MG capsule Take 1 capsule (500 mg total) by mouth 4 (four) times daily. (Patient not taking: Reported on 02/14/2015) 28 capsule 0  . cyclobenzaprine (FLEXERIL) 10 MG tablet Take 1 tablet (10 mg total) by mouth 2 (two) times daily as needed for muscle spasms. (Patient not taking: Reported on 02/14/2015) 10 tablet 0  . escitalopram (LEXAPRO) 10 MG tablet Take 1 tablet (10 mg total) by mouth daily. (Patient not taking: Reported on 02/28/2015) 30 tablet 1  . HYDROcodone-acetaminophen (NORCO/VICODIN) 5-325 MG per tablet Take 2 tablets by mouth every 4 (four) hours as needed. (Patient not taking: Reported on 02/14/2015) 10 tablet 0  . hydrOXYzine (ATARAX/VISTARIL) 25 MG tablet Take one or two tabs by mouth every 6 hours as needed for anxiety (Patient not taking: Reported on 02/28/2015) 20 tablet 0  . naproxen (NAPROSYN) 250 MG tablet Take 250 mg by mouth 4 (four) times daily as needed (pain).    . QUEtiapine (SEROQUEL) 100 MG tablet Take 1 tablet (100 mg total) by mouth at bedtime. (Patient not taking: Reported on 02/28/2015) 30 tablet 1  . traZODone (DESYREL) 50 MG tablet Take 1 tablet (50 mg total) by mouth at bedtime. (Patient not taking: Reported on 02/28/2015) 30 tablet 1  . zolpidem (AMBIEN) 5 MG tablet Take 5 mg by mouth at bedtime as needed for sleep.     No  facility-administered medications prior to visit.    Social History   Social History  . Marital Status: Widowed    Spouse Name: N/A  . Number of Children: N/A  . Years of Education: N/A   Social History Main Topics  . Smoking status: Current Every Day Smoker -- 0.50 packs/day    Types: Cigarettes  . Smokeless tobacco: Never Used  . Alcohol Use: No  . Drug Use: No  . Sexual Activity: Yes    Birth Control/ Protection: Surgical   Other Topics Concern  . None   Social History Narrative     Review of Systems  Constitutional: Negative for fever,  chills and appetite change.  HENT: Negative for congestion, ear pain, postnasal drip, sinus pressure and sore throat.   Eyes: Negative for pain and redness.  Respiratory: Negative for cough, shortness of breath and wheezing.   Cardiovascular: Negative for leg swelling.  Gastrointestinal: Negative for nausea, vomiting, abdominal pain, diarrhea, constipation and blood in stool.  Endocrine: Negative for polyuria.  Genitourinary: Negative for dysuria, urgency, frequency and flank pain.  Musculoskeletal: Negative for gait problem.  Skin: Negative for rash.  Neurological: Negative for weakness and headaches.  Psychiatric/Behavioral: Positive for sleep disturbance and dysphoric mood. Negative for confusion and decreased concentration. The patient is nervous/anxious.     Objective:  BP 112/74 mmHg  Pulse 87  Temp(Src) 97.8 F (36.6 C) (Oral)  Resp 16  Ht  (1.651 m)  Wt 153 lb 12.8 oz (69.763 kg)  BMI 25.59 kg/m2  SpO2 98%  Physical Exam  Constitutional: She is oriented to person, place, and time. She appears well-developed and well-nourished. No distress.  HENT:  Head: Normocephalic and atraumatic.  Right Ear: External ear normal.  Left Ear: External ear normal.  Nose: Nose normal.  Eyes: Conjunctivae and EOM are normal. Pupils are equal, round, and reactive to light. No scleral icterus.  Neck: Normal range of motion. Neck supple. No tracheal deviation present.  Cardiovascular: Normal rate, regular rhythm and normal heart sounds.   Pulmonary/Chest: Effort normal. No respiratory distress. She has no wheezes. She has no rales.  Abdominal: She exhibits no mass. There is no tenderness. There is no rebound and no guarding.  Musculoskeletal: She exhibits no edema.  Lymphadenopathy:    She has no cervical adenopathy.  Neurological: She is alert and oriented to person, place, and time. Coordination normal.  Skin: Skin is warm and dry. No rash noted.  Psychiatric: Her speech is normal  and behavior is normal. Judgment and thought content normal. Her mood appears anxious. Her affect is labile. Cognition and memory are normal. She exhibits a depressed mood.      Assessment & Plan:   Claryce was seen today for anxiety.  Diagnoses and all orders for this visit:  Panic anxiety syndrome  Depression  Opiate dependence, continuous (HCC)  Benzodiazepine dependence, continuous (HCC)  Other orders -     PARoxetine (PAXIL) 20 MG tablet; Take 1 tablet (20 mg total) by mouth daily. -     ALPRAZolam (XANAX) 0.5 MG tablet; Take 1 tablet (0.5 mg total) by mouth 3 (three) times daily as needed for anxiety.  I am having Ms. Fawcett start on PARoxetine and ALPRAZolam. I am also having her maintain her ipratropium-albuterol, ALPRAZolam, zolpidem, cyclobenzaprine, naproxen, cephALEXin, HYDROcodone-acetaminophen, hydrOXYzine, QUEtiapine, escitalopram, traZODone, Eszopiclone, and ALBUTEROL IN.  Meds ordered this encounter  Medications  . Eszopiclone 3 MG TABS    Sig: Take 3 mg by mouth at bedtime.  Take immediately before bedtime  . ALBUTEROL IN    Sig: Inhale into the lungs.  Marland Kitchen PARoxetine (PAXIL) 20 MG tablet    Sig: Take 1 tablet (20 mg total) by mouth daily.    Dispense:  30 tablet    Refill:  5  . ALPRAZolam (XANAX) 0.5 MG tablet    Sig: Take 1 tablet (0.5 mg total) by mouth 3 (three) times daily as needed for anxiety.    Dispense:  45 tablet    Refill:  0   I confronted her with the state narcotic registry. Explained that she had a sleeping pill surplus. He was explained that would not refill her sleeping pills. I gave her a refill on her Xanax to last her for the 2 weeks until her appointment with behavioral health. I explained I would not refill that.   Appropriate red flag conditions were discussed with the patient as well as actions that should be taken.  Patient expressed his understanding.  Follow-up: Return if symptoms worsen or fail to improve.  Carmelina Dane,  MD

## 2015-02-28 NOTE — Patient Instructions (Signed)
Major Depressive Disorder Major depressive disorder is a mental illness. It also may be called clinical depression or unipolar depression. Major depressive disorder usually causes feelings of sadness, hopelessness, or helplessness. Some people with this disorder do not feel particularly sad but lose interest in doing things they used to enjoy (anhedonia). Major depressive disorder also can cause physical symptoms. It can interfere with work, school, relationships, and other normal everyday activities. The disorder varies in severity but is longer lasting and more serious than the sadness we all feel from time to time in our lives. Major depressive disorder often is triggered by stressful life events or major life changes. Examples of these triggers include divorce, loss of your job or home, a move, and the death of a family member or close friend. Sometimes this disorder occurs for no obvious reason at all. People who have family members with major depressive disorder or bipolar disorder are at higher risk for developing this disorder, with or without life stressors. Major depressive disorder can occur at any age. It may occur just once in your life (single episode major depressive disorder). It may occur multiple times (recurrent major depressive disorder). SYMPTOMS People with major depressive disorder have either anhedonia or depressed mood on nearly a daily basis for at least 2 weeks or longer. Symptoms of depressed mood include:  Feelings of sadness (blue or down in the dumps) or emptiness.  Feelings of hopelessness or helplessness.  Tearfulness or episodes of crying (may be observed by others).  Irritability (children and adolescents). In addition to depressed mood or anhedonia or both, people with this disorder have at least four of the following symptoms:  Difficulty sleeping or sleeping too much.   Significant change (increase or decrease) in appetite or weight.   Lack of energy or  motivation.  Feelings of guilt and worthlessness.   Difficulty concentrating, remembering, or making decisions.  Unusually slow movement (psychomotor retardation) or restlessness (as observed by others).   Recurrent wishes for death, recurrent thoughts of self-harm (suicide), or a suicide attempt. People with major depressive disorder commonly have persistent negative thoughts about themselves, other people, and the world. People with severe major depressive disorder may experiencedistorted beliefs or perceptions about the world (psychotic delusions). They also may see or hear things that are not real (psychotic hallucinations). DIAGNOSIS Major depressive disorder is diagnosed through an assessment by your health care provider. Your health care provider will ask aboutaspects of your daily life, such as mood,sleep, and appetite, to see if you have the diagnostic symptoms of major depressive disorder. Your health care provider may ask about your medical history and use of alcohol or drugs, including prescription medicines. Your health care provider also may do a physical exam and blood work. This is because certain medical conditions and the use of certain substances can cause major depressive disorder-like symptoms (secondary depression). Your health care provider also may refer you to a mental health specialist for further evaluation and treatment. TREATMENT It is important to recognize the symptoms of major depressive disorder and seek treatment. The following treatments can be prescribed for this disorder:   Medicine. Antidepressant medicines usually are prescribed. Antidepressant medicines are thought to correct chemical imbalances in the brain that are commonly associated with major depressive disorder. Other types of medicine may be added if the symptoms do not respond to antidepressant medicines alone or if psychotic delusions or hallucinations occur.  Talk therapy. Talk therapy can be  helpful in treating major depressive disorder by providing   support, education, and guidance. Certain types of talk therapy also can help with negative thinking (cognitive behavioral therapy) and with relationship issues that trigger this disorder (interpersonal therapy). A mental health specialist can help determine which treatment is best for you. Most people with major depressive disorder do well with a combination of medicine and talk therapy. Treatments involving electrical stimulation of the brain can be used in situations with extremely severe symptoms or when medicine and talk therapy do not work over time. These treatments include electroconvulsive therapy, transcranial magnetic stimulation, and vagal nerve stimulation.   This information is not intended to replace advice given to you by your health care provider. Make sure you discuss any questions you have with your health care provider.   Document Released: 07/20/2012 Document Revised: 04/15/2014 Document Reviewed: 07/20/2012 Elsevier Interactive Patient Education 2016 [employer]  

## 2015-03-14 ENCOUNTER — Ambulatory Visit (INDEPENDENT_AMBULATORY_CARE_PROVIDER_SITE_OTHER): Payer: Medicare Other | Admitting: Physician Assistant

## 2015-03-14 VITALS — BP 118/80 | HR 72 | Temp 99.0°F | Resp 16 | Ht 65.5 in | Wt 153.6 lb

## 2015-03-14 DIAGNOSIS — F410 Panic disorder [episodic paroxysmal anxiety] without agoraphobia: Secondary | ICD-10-CM

## 2015-03-14 DIAGNOSIS — F32A Depression, unspecified: Secondary | ICD-10-CM

## 2015-03-14 DIAGNOSIS — F1120 Opioid dependence, uncomplicated: Secondary | ICD-10-CM

## 2015-03-14 DIAGNOSIS — F1320 Sedative, hypnotic or anxiolytic dependence, uncomplicated: Secondary | ICD-10-CM

## 2015-03-14 DIAGNOSIS — F329 Major depressive disorder, single episode, unspecified: Secondary | ICD-10-CM

## 2015-03-14 NOTE — Progress Notes (Signed)
[facility] [address] [place] [address] [phone]  Date:  03/14/2015   Name:  Deanna Marshall   DOB:  [birth year]   MRN:  [id number]  PCP:  Ahmed Prima, MD    Chief Complaint: Anxiety and Insomnia   History of Present Illness:  This is a 49 y.o. female with PMH anxiety, depression, hx opiate and benzo dependence who is presenting complaining of anxiety and insomnia. Pt was apparently hospitalized by [facility] in early November for psych complaints. At discharge she was referred to Dr. Gilmore Laroche with [facility] in [place] who she saw on 02/14/15. He started her on lexapro 10 mg, trazodone 50 mg QHS and seroquel 100 mg QHS. He was adamant about not giving her any xanax or opioids. She states she stopped the seroquel and trazodone because they weren't helping. She was seen here by Dr. Dareen Piano on 11/22. Pt was requesting refill of xanax to last until her next psychiatry appt. He gave her #45 Xanax 0.5 mg and ? paxil 20 mg. Unsure why started on paxil. Pt states she is taking the paxil and not the lexapro. [place] controlled substance database shows she filled the xanax on 11/22 and then filled another rx for #30 xanax 0.25 mg on 11/25 rx'd by NP Ameh Nyra Market. Pt states "I think that was from my primary doctor but I think I lost it". She is unsure of f/u appt with Dr. Gilmore Laroche. She states she had an appt 12/10 but they called to reschedule and she is not sure when the appt is. She states she is moving which is causing her stress. Her mother is in the hospital after surgery and planning to discharge home soon. She denies SI/HI.  Review of Systems:  Review of Systems See HPI  Patient Active Problem List   Diagnosis Date Noted  . Opiate dependence, continuous (HCC) 05/13/2014  . Benzodiazepine dependence, continuous (HCC) 05/13/2014  . LEG PAIN, BILATERAL 07/19/2009  . MUSCLE PAIN 03/13/2009  . VERTIGO 03/13/2009  . NAUSEA 03/13/2009  .  HEADACHE 02/15/2009  . LEG CRAMPS 01/20/2009  . ANXIETY 12/09/2008  . ASTHMA 12/09/2008    Prior to Admission medications   Medication Sig Start Date End Date Taking? Authorizing Provider  ALBUTEROL IN Inhale into the lungs.   Yes Historical Provider, MD  ALPRAZolam Prudy Feeler) 0.5 MG tablet Take 1 tablet (0.5 mg total) by mouth 3 (three) times daily as needed for anxiety. 02/28/15  Yes Carmelina Dane, MD  Eszopiclone 3 MG TABS Take 3 mg by mouth at bedtime. Take immediately before bedtime   Yes Historical Provider, MD  ipratropium-albuterol (DUONEB) 0.5-2.5 (3) MG/3ML SOLN Take 3 mLs by nebulization every 6 (six) hours as needed (SOB).    Yes Historical Provider, MD  PARoxetine (PAXIL) 20 MG tablet Take 1 tablet (20 mg total) by mouth daily. 02/28/15  Yes Carmelina Dane, MD  escitalopram (LEXAPRO) 10 MG tablet Take 1 tablet (10 mg total) by mouth daily. Patient not taking: Reported on 02/28/2015 02/14/15   Thresa Ross, MD  QUEtiapine (SEROQUEL) 100 MG tablet Take 1 tablet (100 mg total) by mouth at bedtime. Patient not taking: Reported on 02/28/2015 02/14/15   Thresa Ross, MD  traZODone (DESYREL) 50 MG tablet Take 1 tablet (50 mg total) by mouth at bedtime. Patient not taking: Reported on 02/28/2015 02/14/15   Thresa Ross, MD           Allergies  Allergen Reactions  .  Compazine [Prochlorperazine] Hives  . Ketorolac Tromethamine Swelling  . Morphine And Related Hives    Past Surgical History  Procedure Laterality Date  . Back surgery    . Abdominal hysterectomy    . Insertion of mesh      Social History  Substance Use Topics  . Smoking status: Current Every Day Smoker -- 0.50 packs/day    Types: Cigarettes  . Smokeless tobacco: Never Used  . Alcohol Use: No    Family History  Problem Relation Age of Onset  . Cancer Mother   . Diabetes Mother   . Hypertension Mother   . Mental illness Mother   . Cancer Father   . Diabetes Other   . Hypertension Other      Medication list has been reviewed and updated.  Physical Examination:  Physical Exam  Constitutional: She is oriented to person, place, and time. She appears well-developed and well-nourished. No distress.  HENT:  Head: Normocephalic and atraumatic.  Right Ear: Hearing normal.  Left Ear: Hearing normal.  Nose: Nose normal.  Eyes: Conjunctivae and lids are normal. Right eye exhibits no discharge. Left eye exhibits no discharge. No scleral icterus.  Pulmonary/Chest: Effort normal. No respiratory distress.  Musculoskeletal: Normal range of motion.  Neurological: She is alert and oriented to person, place, and time.  Skin: Skin is warm, dry and intact. No lesion and no rash noted.  Psychiatric: Her speech is normal and behavior is normal. Thought content normal.  Mood flat    BP 118/80 mmHg  Pulse 72  Temp(Src) 99 F (37.2 C) (Oral)  Resp 16  Ht 5' 5.5" (1.664 m)  Wt 153 lb 9.6 oz (69.673 kg)  BMI 25.16 kg/m2  SpO2 98%  Assessment and Plan:  1. Panic anxiety syndrome 2. Depression 3. Opiate dependence, continuous 4. Benzodiazepine dependence, continuous Explained that I could not give her any xanax or sleep medicine esp since Dr. Gilmore LarocheAkhtar, her psychiatrist, advised against it at last visit 02/14/15. [place] controlled substance database shows she is getting xanax from several different providers. I called [facility] and made appt for pt with Dr. Gilmore LarocheAkhtar on 03/16/15 at 2 PM. Pt was agreeable to this plan. She does not have any SI/HI currently but advised she go to the ED if symptoms worsen.   Roswell MinersNicole V. Dyke BrackettBush, PA-C, MHS [facility]  03/14/2015

## 2015-03-14 NOTE — Patient Instructions (Signed)
You have an appointment with Dr. Gilmore [facility]htar at [facility] [place] on 12/8 at 2:00 PM He will be able to help you with your anxiety and sleep issues

## 2015-03-16 ENCOUNTER — Encounter (HOSPITAL_COMMUNITY): Payer: Self-pay | Admitting: Psychiatry

## 2015-03-16 ENCOUNTER — Ambulatory Visit (INDEPENDENT_AMBULATORY_CARE_PROVIDER_SITE_OTHER): Payer: Medicare Other | Admitting: Psychiatry

## 2015-03-16 VITALS — BP 126/70 | HR 62 | Ht 63.0 in | Wt 153.0 lb

## 2015-03-16 DIAGNOSIS — G4700 Insomnia, unspecified: Secondary | ICD-10-CM | POA: Diagnosis not present

## 2015-03-16 DIAGNOSIS — F1320 Sedative, hypnotic or anxiolytic dependence, uncomplicated: Secondary | ICD-10-CM

## 2015-03-16 DIAGNOSIS — F0630 Mood disorder due to known physiological condition, unspecified: Secondary | ICD-10-CM | POA: Diagnosis not present

## 2015-03-16 DIAGNOSIS — F4310 Post-traumatic stress disorder, unspecified: Secondary | ICD-10-CM | POA: Diagnosis not present

## 2015-03-16 MED ORDER — ESCITALOPRAM OXALATE 10 MG PO TABS: 15.0000 mg | ORAL_TABLET | Freq: Every day | ORAL | Status: DC

## 2015-03-16 MED ORDER — ESZOPICLONE 3 MG PO TABS: 3.0000 mg | ORAL_TABLET | Freq: Every day | ORAL | Status: DC

## 2015-03-16 MED ORDER — LAMOTRIGINE 25 MG PO TABS: 25.0000 mg | ORAL_TABLET | Freq: Every day | ORAL | Status: DC
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
## 2015-03-17 ENCOUNTER — Ambulatory Visit (INDEPENDENT_AMBULATORY_CARE_PROVIDER_SITE_OTHER): Payer: Medicare Other | Admitting: Emergency Medicine

## 2015-03-17 VITALS — BP 114/76 | HR 87 | Temp 97.7°F | Resp 18 | Ht 65.5 in | Wt 155.2 lb

## 2015-03-17 DIAGNOSIS — F1320 Sedative, hypnotic or anxiolytic dependence, uncomplicated: Secondary | ICD-10-CM

## 2015-03-17 DIAGNOSIS — F410 Panic disorder [episodic paroxysmal anxiety] without agoraphobia: Secondary | ICD-10-CM

## 2015-03-17 DIAGNOSIS — F1120 Opioid dependence, uncomplicated: Secondary | ICD-10-CM | POA: Diagnosis not present
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
## 2015-03-21 ENCOUNTER — Telehealth (HOSPITAL_COMMUNITY): Payer: Self-pay | Admitting: *Deleted

## 2015-03-21 NOTE — Telephone Encounter (Signed)
Pt would like to speak to you about her medications. Pt states she is unable to sleep, more anxious. Pt would like a medication to calm her down. Please call to advise [phone].

## 2015-03-22 NOTE — Telephone Encounter (Signed)
Pt would like to speak with Dr. Gilmore LarocheAkhtar. Pt states she can't focus and feels scared when she wakes up. Please call to advise to [phone].

## 2015-03-23 NOTE — Telephone Encounter (Signed)
Return telephone call to pt. Pt would like to have a prescription for Xanax. Informed pt, per Dr. Gilmore LarocheAkhtar, we will not prescribe Xanax for pt. Pt states she will contact her pcp.

## 2015-03-24 NOTE — Telephone Encounter (Signed)
Pt states she is feeling more anxious and nervous. Pt states she needs something for sleep and would like to be seen today. Informed pt Dr. Gilmore LarocheAkhtar is out of the office today. Informed pt she will need to proceed to her nearest ER or urgent care if symptoms are worsen. Pt verbalizes understanding.

## 2015-03-26 ENCOUNTER — Ambulatory Visit (INDEPENDENT_AMBULATORY_CARE_PROVIDER_SITE_OTHER): Payer: Medicare Other | Admitting: Family Medicine

## 2015-03-26 VITALS — BP 122/72 | HR 75 | Temp 98.4°F | Resp 17 | Ht 64.5 in | Wt 156.0 lb

## 2015-03-26 DIAGNOSIS — F410 Panic disorder [episodic paroxysmal anxiety] without agoraphobia: Secondary | ICD-10-CM

## 2015-03-26 DIAGNOSIS — F411 Generalized anxiety disorder: Secondary | ICD-10-CM | POA: Diagnosis not present

## 2015-03-26 DIAGNOSIS — F1910 Other psychoactive substance abuse, uncomplicated: Secondary | ICD-10-CM | POA: Diagnosis not present

## 2015-03-26 DIAGNOSIS — Z765 Malingerer [conscious simulation]: Secondary | ICD-10-CM

## 2015-03-26 NOTE — Progress Notes (Signed)
Subjective:  By signing my name below, I, Deanna Marshall, attest that this documentation has been prepared under the direction and in the presence of Meredith Staggers, MD.  Electronically Signed: Andrew Au, ED Scribe. 03/26/2015. 11:34 AM.   Patient ID: Deanna Marshall, female    DOB: 05-30-1965, 49 y.o.   MRN: [id number]  HPI  Chief Complaint  Patient presents with  . Anxiety  . Panic Attack   HPI Comments: Deanna Marshall is a 49 y.o. female who presents to the [facility] complaining of anxiety. Pt has hx of anxiety and depression. On review of her problem list and outside medical record, hx of bipolar disorder, chronic pain syndrome, combination of drug dependence, and benzodiazepine dependence. She was last seen here 9 days ago by Dr. Dareen Piano for anxiety attacks. She had been seen 3 days prior by another provider in our office and at that visit it was noted she was receiving xanax from several other providers.   Arrangement was made for an appointment with Dr. Gilmore Laroche on 12/8, her psychiatrist.  Sudie Grumbling on 12/8 reviewed,  Per that note there was an understanding that she would not receive benzodiazepines and consider detox. Also advised to give her other psychiatric medication more time to work (Lexapro). Seroquel was discontinued, started Lamictal with plan to increase to 50 mg in 1 week, increased lexapro , referred to therapy, plan to stop trazodone and start lunesta 3 mg.  Notation to avoid benzodiazepine and noted she is familiar with detox if needed. Plan to f/u in 3 weeks with psychiatry.   She was seen by Dr. Dareen Piano the following day, and by his note - stated she need her benzodiazepine refill. Telephone note 12/13 noted she requested something to calm her nerves from psychiatry. On 12/15 she requested xanax. Advised to go to ER or urgent care12/16.  Pt states she has been having worsening, anxiety attacks. States "there has been a lot going on at home". She's been having   panic attacks every couple days for the past 3-4 months. States during the attacks she cannot drive, feels as if she is closed in, and afraid. She has been taking Lexapro, Lunesta and Lamictal. She states she no longer feels comfortable with her psychiatrist and does not see therapist until next month. States she has been on xanax in the past, which she states works well for her. She reports she last had xanax filled was 11/22, prescribed by Dr. Dareen Piano and has not had any since. She denies receiving prescription elsewhere. She denies addiction and dependency to this medications. She denies being told there was a concern for her being on this medication. She denies SI/HI, self harm, hallucination.    Patient Active Problem List   Diagnosis Date Noted  . Panic anxiety syndrome 03/14/2015  . Depression 03/14/2015  . Opiate dependence, continuous (HCC) 05/13/2014  . Benzodiazepine dependence, continuous (HCC) 05/13/2014  . LEG PAIN, BILATERAL 07/19/2009  . MUSCLE PAIN 03/13/2009  . VERTIGO 03/13/2009  . NAUSEA 03/13/2009  . HEADACHE 02/15/2009  . LEG CRAMPS 01/20/2009  . ANXIETY 12/09/2008  . ASTHMA 12/09/2008   Past Medical History  Diagnosis Date  . Asthma   . Chronic back pain   . Anxiety   . COPD (chronic obstructive pulmonary disease) [facility])    Past Surgical History  Procedure Laterality Date  . Back surgery    . Abdominal hysterectomy    . Insertion of mesh     Allergies  Allergen  Reactions  . Compazine [Prochlorperazine] Hives  . Ketorolac Tromethamine Swelling  . Morphine And Related Hives   Prior to Admission medications   Medication Sig Start Date End Date Taking? Authorizing Provider  ALBUTEROL IN Inhale into the lungs.   Yes Historical Provider, MD  escitalopram (LEXAPRO) 10 MG tablet Take 1.5 tablets (15 mg total) by mouth daily. 03/16/15  Yes Thresa RossNadeem Akhtar, MD  Eszopiclone 3 MG TABS Take 1 tablet (3 mg total) by mouth at bedtime. Take immediately before bedtime  03/16/15  Yes Thresa RossNadeem Akhtar, MD  ipratropium-albuterol (DUONEB) 0.5-2.5 (3) MG/3ML SOLN Take 3 mLs by nebulization every 6 (six) hours as needed (SOB).    Yes Historical Provider, MD  lamoTRIgine (LAMICTAL) 25 MG tablet Take 1 tablet (25 mg total) by mouth daily. Take one tablet daily for a week and then start taking 2 tablets. Patient not taking: Reported on 03/26/2015 03/16/15   Thresa RossNadeem Akhtar, MD   Social History   Social History  . Marital Status: Widowed    Spouse Name: N/A  . Number of Children: N/A  . Years of Education: N/A   Occupational History  . Not on file.   Social History Main Topics  . Smoking status: Current Every Day Smoker -- 0.50 packs/day for 20 years    Types: Cigarettes  . Smokeless tobacco: Never Used  . Alcohol Use: No  . Drug Use: No  . Sexual Activity: Yes    Birth Control/ Protection: Surgical   Other Topics Concern  . Not on file   Social History Narrative   Review of Systems  Constitutional: Negative for fever and chills.  Psychiatric/Behavioral: Negative for suicidal ideas, hallucinations and self-injury. The patient is nervous/anxious.     Objective:   Physical Exam  Constitutional: She is oriented to person, place, and time. She appears well-developed and well-nourished. No distress.  HENT:  Head: Normocephalic and atraumatic.  Eyes: Conjunctivae and EOM are normal.  Neck: Neck supple. No thyromegaly present.  Cardiovascular: Normal rate, regular rhythm and normal heart sounds.  Exam reveals no gallop and no friction rub.   No murmur heard. Pulmonary/Chest: Effort normal and breath sounds normal. She has no wheezes. She has no rales.  Musculoskeletal: Normal range of motion.  Neurological: She is alert and oriented to person, place, and time.  Skin: Skin is warm and dry.  Psychiatric: She has a normal mood and affect. Her behavior is normal.  Nursing note and vitals reviewed.  Filed Vitals:   03/26/15 1112  BP: 122/72  Pulse: 75    Temp: 98.4 F (36.9 C)  TempSrc: Oral  Resp: 17  Height: 5' 4.5" (1.638 m)  Weight: 156 lb (70.761 kg)  SpO2: 98%   Controlled substance data base reviewed  She received 30 xanax 0.5 mg on 11/25 and 45 on 11/22, by 2 separate providers. Most recent lunesta 3mg  on 12/8.  Assessment & Plan:  Deanna GlassingDawn Frontera is a 49 y.o. female Anxiety state  Panic attacks  Drug-seeking behavior   Hx of anxiety and panic attacks, followed by psychiatry.  Requesting xanax. See information above of multiple requests for this medication and hx of benzodiazepine dependence. Discussed with her the conflicting information on controlled substance database and her history.  Also discussed plan from psychiatry to no use of benzodiazepines, but she denied that plan.  - Denies addiction to benzodiazepines, but resources provided in AVS.   -will not prescribe Xanax due to above and plan from psychiatry   -may need  to increase Lamictal to  QD as planned, and follow up with therapist  -if 2nd opinion requested, or change in psychiatrist - numbers/options given in AVS.   -RTC/ER precautions   No orders of the defined types were placed in this encounter.   Patient Instructions  As we discussed, I'm concerned about your request for the Xanax and the discrepancy in information you have provided to me versus what your psychiatrist his discussed with you as well as the multiple office visits at our office recently. There is also a discrepancy in the xanax that has been prescribed to you versus what you remember filling.  I am unable to provide any xanax at this time as it was also recommended by your psychiatrist to NOT use Xanax.  As your medicines were recently changed for anxiety, it may take a little more time for this take effect. It appears you should be on the 2 pills per day of Lamictal as your psychiatrist recommended, and should be followed up with the therapist/counseling per his note.   If you feel acutely more  anxious or worsening of your symptoms, present to an emergency room or Behavioral Health as listed below, or call 911 if you feel it is an emergency. I listed other names and numbers of psychiatry offices in town as you requested if you would prefer a different provider.  Return to the clinic or go to the nearest emergency room if any of your symptoms worsen or new symptoms occur.   If you feel you are addicted to benzodiazepines (xanax), and needs some help coming off of these, here are some resources in the community for help if needed: [facility]: [phone] [facility]: [phone] [facility]: [phone]  Behavioral [facility]:  [phone]  [facility]: [phone]     I personally performed the services described in this documentation, which was scribed in my presence. The recorded information has been reviewed and considered, and addended by me as needed.

## 2015-03-26 NOTE — Patient Instructions (Addendum)
As we discussed, I'm concerned about your request for the Xanax and the discrepancy in information you have provided to me versus what your psychiatrist his discussed with you as well as the multiple office visits at our office recently. There is also a discrepancy in the xanax that has been prescribed to you versus what you remember filling.  I am unable to provide any xanax at this time as it was also recommended by your psychiatrist to NOT use Xanax.  As your medicines were recently changed for anxiety, it may take a little more time for this take effect. It appears you should be on the 2 pills per day of Lamictal as your psychiatrist recommended, and should be followed up with the therapist/counseling per his note.   If you feel acutely more anxious or worsening of your symptoms, present to an emergency room or Behavioral Health as listed below, or call 911 if you feel it is an emergency. I listed other names and numbers of psychiatry offices in town as you requested if you would prefer a different provider.  Return to the clinic or go to the nearest emergency room if any of your symptoms worsen or new symptoms occur.   If you feel you are addicted to benzodiazepines (xanax), and needs some help coming off of these, here are some resources in the community for help if needed: [facility]: [phone] [facility]: [phone] [facility]: [phone]  [facility]:  [phone]  [facility]: [phone]

## 2015-04-04 ENCOUNTER — Ambulatory Visit (HOSPITAL_COMMUNITY): Payer: Medicare Other | Admitting: Licensed Clinical Social Worker

## 2015-04-14 ENCOUNTER — Ambulatory Visit (HOSPITAL_COMMUNITY): Payer: Medicare Other | Admitting: Psychiatry

## 2015-04-17 ENCOUNTER — Ambulatory Visit (HOSPITAL_COMMUNITY): Payer: Self-pay | Admitting: Psychiatry

## 2015-05-10 ENCOUNTER — Ambulatory Visit (INDEPENDENT_AMBULATORY_CARE_PROVIDER_SITE_OTHER): Payer: Medicare Other | Admitting: Psychiatry

## 2015-05-10 ENCOUNTER — Encounter (HOSPITAL_COMMUNITY): Payer: Self-pay | Admitting: Psychiatry

## 2015-05-10 VITALS — BP 130/80 | HR 89 | Ht 64.5 in | Wt 151.0 lb

## 2015-05-10 DIAGNOSIS — F0630 Mood disorder due to known physiological condition, unspecified: Secondary | ICD-10-CM | POA: Diagnosis not present

## 2015-05-10 DIAGNOSIS — F4310 Post-traumatic stress disorder, unspecified: Secondary | ICD-10-CM

## 2015-05-10 DIAGNOSIS — G4700 Insomnia, unspecified: Secondary | ICD-10-CM

## 2015-05-10 MED ORDER — ESZOPICLONE 3 MG PO TABS: 3.0000 mg | ORAL_TABLET | Freq: Every day | ORAL | Status: DC

## 2015-05-10 MED ORDER — ZOLPIDEM TARTRATE 10 MG PO TABS: 10.0000 mg | ORAL_TABLET | Freq: Every evening | ORAL | Status: DC | PRN

## 2015-05-10 MED ORDER — LAMOTRIGINE 100 MG PO TABS: 100.0000 mg | ORAL_TABLET | Freq: Every day | ORAL | Status: DC

## 2015-05-10 MED ORDER — MIRTAZAPINE 15 MG PO TBDP: 15.0000 mg | ORAL_TABLET | Freq: Every day | ORAL | Status: DC

## 2015-05-10 NOTE — Addendum Note (Signed)
Addended by: Thresa Ross on: 05/10/2015 11:45 AM   Modules accepted: Orders, Medications

## 2015-05-10 NOTE — Progress Notes (Addendum)
Patient ID: Deanna Marshall, female   DOB: 07-14-1965, 50 y.o.   MRN: [id number]  [facility] Outpatient Visit  Patient Identification: Deanna Marshall MRN:  [id number] Date of Evaluation:  05/10/2015 Referral Source: Hospital Discharge from [facility] Chief Complaint:   Chief Complaint    Follow-up     Visit Diagnosis:    ICD-9-CM ICD-10-CM   1. Mood disorder in conditions classified elsewhere 293.83 F06.30   2. PTSD (post-traumatic stress disorder) 309.81 F43.10   3. Insomnia 780.52 G47.00    Diagnosis:   Patient Active Problem List   Diagnosis Date Noted  . Panic anxiety syndrome [F41.0] 03/14/2015  . Depression [F32.9] 03/14/2015  . Opiate dependence, continuous (HCC) [F11.20] 05/13/2014  . Benzodiazepine dependence, continuous (HCC) [F13.20] 05/13/2014  . LEG PAIN, BILATERAL [M79.609] 07/19/2009  . MUSCLE PAIN [IMO0001] 03/13/2009  . VERTIGO [R42] 03/13/2009  . NAUSEA [R11.0] 03/13/2009  . HEADACHE [R51] 02/15/2009  . LEG CRAMPS [R25.2] 01/20/2009  . ANXIETY [F41.1] 12/09/2008  . ASTHMA [J45.909] 12/09/2008   History of Present Illness:  50 years old, single Caucasian female who was referred initially by [facility] as a hospital follow-up discharge. 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 She has a complicated history of using benzodiazepine and has seen multiple providers. She has been on Suboxone and methadone in the past.  Last visit he stopped trazodone because she was not able to take it or did not like it. Started Alfonso Patten does help her sleep. Apparently she also stopped taking Lexapro said that causing headache. She is also started Lamictal she is tolerating that reasonable but overall she is overwhelmed she is moved to her place but she feels down and depressed states that she is not suicidal or wants to go to hospital. Says that she also went to the hospital emergency room and got some Xanax  because that is the only medication that helps. She has had dependency with pain medication past and she has been on Suboxone and methadone in the past.  Bipolar depressed; on lamictal gets overwhelmed easily . Poor coping skills. She missed her therapy appointment..  Aggravating factors;  move from her apartment. She is currently on disability for back surgeries. She witnessed the suicide that off for husband in 2006 says that she still has flashbacks and nightmares about them. Finances Modifying factors; her children. Disability money. Location; depression, anxiety, pain Severity; 3 out of 10. 10 being no depression Infrequent  intrusive nightmares at times relevant to her husbands committing suicide in 2006. Context; pain and depression Duration; more then 5 years   Associated Signs/Symptoms: Depression Symptoms:  fatigue, difficulty concentrating, anxiety, disturbed sleep, (Hypo) Manic Symptoms:  Distractibility, Anxiety Symptoms:  Excessive Worry, Psychotic Symptoms:  denies PTSD Symptoms: Flashbacks    Past Psychiatric History: Mostly outpatient treatment with mood stabilizers or SSRI. No regular follow with one provider. According to notes in the chart she has gotten medications from different providers in past.  No prior psychiatric admission or suicide attempt prior to this recent old [facility] admission in November 2016 Past Medical History:  Past Medical History  Diagnosis Date  . Asthma   . Chronic back pain   . Anxiety   . COPD (chronic obstructive pulmonary disease) [facility])     Past Surgical History  Procedure Laterality Date  . Back surgery    . Abdominal hysterectomy    . Insertion of mesh     Family History:  Family History  Problem Relation Age of Onset  . Cancer Mother   . Diabetes Mother   . Hypertension Mother   . Mental illness Mother   . Cancer Father   . Diabetes Other   . Hypertension Other    Social History:   Social History    Social History  . Marital Status: Widowed    Spouse Name: N/A  . Number of Children: N/A  . Years of Education: N/A   Social History Main Topics  . Smoking status: Current Every Day Smoker -- 0.50 packs/day for 20 years    Types: Cigarettes  . Smokeless tobacco: Never Used  . Alcohol Use: No  . Drug Use: No  . Sexual Activity: Yes    Birth Control/ Protection: Surgical   Other Topics Concern  . None   Social History Narrative      Musculoskeletal: Strength & Muscle Tone: within normal limits Gait & Station: normal Patient leans: Front  Psychiatric Specialty Exam: HPI  Review of Systems  Constitutional: Negative for fever.  Cardiovascular: Negative for chest pain.  Skin: Negative for rash.  Neurological: Negative for tremors and headaches.  Psychiatric/Behavioral: Positive for depression. Negative for suicidal ideas. The patient is nervous/anxious and has insomnia.     Blood pressure 130/80, pulse 89, height 5' 4.5" (1.638 m), weight 151 lb (68.493 kg), SpO2 90 %.Body mass index is 25.53 kg/(m^2).  General Appearance: Casual  Eye Contact:  Fair  Speech:  Slow  Volume:  Decreased  Mood:  Depressed and Dysphoric  Affect:  Congruent and Constricted  Thought Process:  Coherent  Orientation:  Full (Time, Place, and Person)  Thought Content:  Rumination  Suicidal Thoughts:  No  Homicidal Thoughts:  No  Memory:  Immediate;   Fair Recent;   Fair  Judgement:  Poor  Insight:  Shallow  Psychomotor Activity:  Decreased  Concentration:  Fair  Recall:  Poor  Fund of Knowledge:Fair  Language: Fair  Akathisia:  Negative  Handed:  Right  AIMS (if indicated):    Assets:  Desire for Improvement  ADL's:  Intact  Cognition: WNL  Sleep:  poor   Is the patient at risk to self?  No. Has the patient been a risk to self in the past 6 months?  No. Has the patient been a risk to self within the distant past?  No. Is the patient a risk to others?  No.  Allergies:    Allergies  Allergen Reactions  . Compazine [Prochlorperazine] Hives  . Ketorolac Tromethamine Swelling  . Morphine And Related Hives   Current Medications: Current Outpatient Prescriptions  Medication Sig Dispense Refill  . ALBUTEROL IN Inhale into the lungs.    . ALPRAZolam (XANAX) 0.25 MG tablet Take 0.25 mg by mouth.    . Eszopiclone 3 MG TABS Take 1 tablet (3 mg total) by mouth at bedtime. Take immediately before bedtime 30 tablet 1  . ipratropium-albuterol (DUONEB) 0.5-2.5 (3) MG/3ML SOLN Take 3 mLs by nebulization every 6 (six) hours as needed (SOB).     Marland Kitchen escitalopram (LEXAPRO) 10 MG tablet Take 1.5 tablets (15 mg total) by mouth daily. (Patient not taking: Reported on 05/10/2015) 45 tablet 0  . lamoTRIgine (LAMICTAL) 100 MG tablet Take 1 tablet (100 mg total) by mouth daily. 30 tablet 1  . mirtazapine (REMERON SOL-TAB) 15 MG disintegrating tablet Take 1 tablet (15 mg total) by mouth at bedtime. 30 tablet 1  . [DISCONTINUED] PARoxetine (PAXIL) 20 MG tablet Take 1  tablet (20 mg total) by mouth daily. (Patient not taking: Reported on 03/16/2015) 30 tablet 5  . [DISCONTINUED] QUEtiapine (SEROQUEL) 100 MG tablet Take 1 tablet (100 mg total) by mouth at bedtime. (Patient not taking: Reported on 02/28/2015) 30 tablet 1  . [DISCONTINUED] traZODone (DESYREL) 50 MG tablet Take 1 tablet (50 mg total) by mouth at bedtime. (Patient not taking: Reported on 02/28/2015) 30 tablet 1   No current facility-administered medications for this visit.    Previous Psychotropic Medications: Yes  Neurontin: made her weak buspar didn't help She has been on xanax and klonopine before.  Lexapro; caused nausea   Substance Abuse History in the last 12 months:  No.  She has been on suboxone before. Hospitalized in October 2016 to stop suboxone. Methadone clinic in past   Consequences of Substance Abuse: Withdrawal Symptoms:   Nausea Patient aware of possible withdrawal symptoms of opiate and benzo and  currently is not taking  Medical Decision Making:  Review of Psycho-Social Stressors (1), Decision to obtain old records (1), Review of Medication Regimen & Side Effects (2) and Review of New Medication or Change in Dosage (2)  Treatment Plan Summary: Medication management and Plan as follows   Patient history is complicated with use of benzodiazepines and opioids after having had back surgeries. This may also had affected her mood. She understands she would not get benzodiazepine considering her history and vulnerability to dependency . She is recommended to get psychotherapy to develop positive coping skills. Apparently she missed her last appointment with therapist  Mood disorder or Bipolar disorder; worsened. increase lamictal 100mg  qd. PTSD:not taking lexapro. Will substitute with remeron 15mg  which has less GI side effects Insomnia: continue lunesta 3mg . Reviewed sleep hygiene. Patient returned back and says Remus Loffler has worked better and cheaper. Wants ambien 10mg  . Will delete lunesta.  Benzo dependence: avoid benzodiazepines. Patient already familiar with detox or services if needed Medical complexity/back pain: follow up with her primary care or relavant providers Psychotherapy to be scheduled considering her ongoing stressors including possible PTSD-like symptoms. She is also advised that medications will continue but she is to follow up with her therapy sessions Reviewed sleep hygiene Denies suicidal toughts or want to be hospitalized.   More than 50% spent in counseling and coordination of care including medication education abuse side effects Call 911 or report local emergency room for any urgent concerns or suicidal thoughts  Follow up in 3 to 4 weeks.   Damiean Lukes 2/1/201711:35 AM

## 2015-05-11 ENCOUNTER — Telehealth (HOSPITAL_COMMUNITY): Payer: Self-pay | Admitting: *Deleted

## 2015-05-11 NOTE — Telephone Encounter (Signed)
i have seen her yesterday. She wanted lunesta and at later check out wanted ambien instead. We have adjusted medications yesterday. She was informed our first visit to keep away from benzodiazepine and she agreed that she will not get it here or elsewhere.  Report to local ED or urgent care for any urgent concerns .  She has stopped medications or not followed with our recommendations in past, that would be breach of outpatient agreement.

## 2015-05-11 NOTE — Telephone Encounter (Signed)
Pt would like to speak with Dr.Akhtar about her medication. Pt states she is shaking when she wakes up, having trouble sleeping. Pt states she would like to change her medication.  Please call to advise at [phone].

## 2015-05-12 ENCOUNTER — Ambulatory Visit (INDEPENDENT_AMBULATORY_CARE_PROVIDER_SITE_OTHER): Payer: Medicare Other | Admitting: Family Medicine

## 2015-05-12 VITALS — BP 108/68 | HR 75 | Temp 98.3°F | Resp 20 | Ht 65.0 in | Wt 156.8 lb

## 2015-05-12 DIAGNOSIS — F4323 Adjustment disorder with mixed anxiety and depressed mood: Secondary | ICD-10-CM | POA: Diagnosis not present

## 2015-05-12 DIAGNOSIS — F4321 Adjustment disorder with depressed mood: Secondary | ICD-10-CM | POA: Diagnosis not present

## 2015-05-12 DIAGNOSIS — F332 Major depressive disorder, recurrent severe without psychotic features: Secondary | ICD-10-CM

## 2015-05-12 DIAGNOSIS — G4700 Insomnia, unspecified: Secondary | ICD-10-CM

## 2015-05-12 MED ORDER — CLONAZEPAM 0.5 MG PO TABS: ORAL_TABLET | ORAL | Status: DC

## 2015-05-12 MED ORDER — TRAZODONE HCL 50 MG PO TABS: 25.0000 mg | ORAL_TABLET | Freq: Every evening | ORAL | Status: DC | PRN

## 2015-05-12 NOTE — Progress Notes (Signed)
Patient ID: Deanna Marshall, female    DOB: Jan 12, 1966  Age: 50 y.o. MRN: [id number]  Chief Complaint  Patient presents with  . Panic Attack  . Anxiety  . Depression    see screeening    Subjective:   Patient comes here with a lot of anxiety and depression. She saw her psychiatrist 2 days ago, and was begun on Remeron. She spoke to his office yesterday and they told her to see a PCP. She has an appointment with him in 2 months. She does not feel like she has had good communication, that he has not taken time to get to know her. I advised that that might take some time. She has had trouble finding someone who would take Medicare. She would like a primary care doctor. She smokes. She uses inhalers. She gets exercise about 2 days a week. She helps take care of her 57 year old mother. Her father died 2 months ago which was difficult for her. She had help care for him. Her husband committed suicide about 6 months ago. She used to consider herself to be [facility], but has not been a part of the church now for some time. She denies drinking alcohol or using substances. She has a history of having been on methadone, but that was post back surgery. She has been on disability for many years. She lives alone in an apartment. She has very few other relationships in her life.  Current allergies, medications, problem list, past/family and social histories reviewed.  Objective:  BP 108/68 mmHg  Pulse 75  Temp(Src) 98.3 F (36.8 C) (Oral)  Resp 20  Ht  (1.651 m)  Wt 156 lb 12.8 oz (71.124 kg)  BMI 26.09 kg/m2  SpO2 94%  Depressed and disheveled in appearance. Chest clear. Heart regular without murmurs. Had a long talk with her for about 20 minutes.  Assessment & Plan:   Assessment: 1. Adjustment disorder with mixed anxiety and depressed mood   2. Severe episode of recurrent major depressive disorder, without psychotic features (HCC)   3. Grief   4. Insomnia       Plan:  Decided to give her a  little clonazepam to help her until Remeron does function on her. She needs to further follow-up with psychiatry. Urged her to seek to be physically healthy and get rid of the cigarettes. She needs to get regular exercise. Urged her to continue pursuing emotional health. Urged her to develop healthy relationships with some people that can be your friends. Finally urge her to get back to some spiritual health.  No orders of the defined types were placed in this encounter.    Meds ordered this encounter  Medications  . clonazePAM (KLONOPIN) 0.5 MG tablet    Sig: Take maximum of one daily for anxiety only when needed.    Dispense:  30 tablet    Refill:  0  . DISCONTD: traZODone (DESYREL) 50 MG tablet    Sig: Take 0.5-1 tablets (25-50 mg total) by mouth at bedtime as needed for sleep.    Dispense:  30 tablet    Refill:  1         Patient Instructions  Work hard on finding a psychiatrist that satisfies your goal in a provider, or keep seeing your current doctor  Get daily exercise  You need to develop some friendships and acquaintances so you have people to talk to  The Remeron that they began you on 2 days ago will take a couple  of weeks before he is kicking into gear and making you feel better  I strongly urge you to get a therapist in the near future  Start working on tapering year cigarette use with a set goals for quitting them by a certain date.  You have stated that you are [facility]. I would urge you to get back into a church fellowship and to be reading her Bible. Recommend reading in the Psalms and the book of Philipeans.  Take the clonopin 0.5 mg only for severe anxiety, maximum of one daily.  Make them last because I cannot continuing refilling.  In the event of getting acutely worse or suicidal thinking go straight to the emergency room.   From Dr. Paralee Cancel last instructions: If you feel you are addicted to benzodiazepines (xanax), and needs some help coming off of  these, here are some resources in the community for help if needed: AD[facility]: [phone] [facility]: [phone] [facility]: [phone]  Behavioral Health:  [phone]  [facility]: [phone]         Return if symptoms worsen or fail to improve.   Tayshaun Kroh, MD 05/12/2015

## 2015-05-12 NOTE — Patient Instructions (Addendum)
Work hard on finding a psychiatrist that satisfies your goal in a provider, or keep seeing your current doctor  Get daily exercise  You need to develop some friendships and acquaintances so you have people to talk to  The Remeron that they began you on 2 days ago will take a couple of weeks before he is kicking into gear and making you feel better  I strongly urge you to get a therapist in the near future  Start working on tapering year cigarette use with a set goals for quitting them by a certain date.  You have stated that you are [facility]. I would urge you to get back into a church fellowship and to be reading her Bible. Recommend reading in the Psalms and the book of Philipeans.  Take the clonopin 0.5 mg only for severe anxiety, maximum of one daily.  Make them last because I cannot continuing refilling.  In the event of getting acutely worse or suicidal thinking go straight to the emergency room.   From Dr. Paralee Cancel last instructions: If you feel you are addicted to benzodiazepines (xanax), and needs some help coming off of these, here are some resources in the community for help if needed: [facility]: [phone] [facility]: [phone] [facility]: [phone]  Behavioral Health:  [phone]  [facility]: [phone]

## 2015-05-12 NOTE — Telephone Encounter (Signed)
Return telephone call to pt. Pt expresses she would like a stronger medication to help her sleep.Per conversation with Dr. Gilmore Laroche, please informed pt medications were adjusted on 05/10/15, pt will need to keep away from benzodiazepines. If patient symptoms worsen, pt will need to proceed to the local ED or urgent care. Pt verbalizes understanding.

## 2015-05-17 ENCOUNTER — Telehealth (HOSPITAL_COMMUNITY): Payer: Self-pay | Admitting: *Deleted

## 2015-05-17 DIAGNOSIS — F1310 Sedative, hypnotic or anxiolytic abuse, uncomplicated: Secondary | ICD-10-CM

## 2015-05-17 NOTE — Telephone Encounter (Signed)
UDS to rule out substance abuse printed

## 2015-05-17 NOTE — Telephone Encounter (Signed)
Urine drug Screen

## 2015-05-19 ENCOUNTER — Telehealth (HOSPITAL_COMMUNITY): Payer: Self-pay | Admitting: *Deleted
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
## 2015-05-20 LAB — DRUG SCREEN URINE W/ALC, PAIN MGMT, REFLEX
Amphetamine Screen, Ur: NEGATIVE
Barbiturate Quant, Ur: NEGATIVE
Cocaine Metabolites: NEGATIVE
Creatinine,U: 177.81 mg/dL
Ethyl Alcohol: <10 mg/dL (ref <10)
Marijuana Metabolite: NEGATIVE
Opiates: NEGATIVE
Phencyclidine (PCP): NEGATIVE
Propoxyphene: NEGATIVE

## 2015-05-21 ENCOUNTER — Ambulatory Visit (INDEPENDENT_AMBULATORY_CARE_PROVIDER_SITE_OTHER): Payer: Medicare Other | Admitting: Family Medicine

## 2015-05-21 VITALS — BP 116/72 | HR 80 | Temp 98.2°F | Resp 18 | Wt 149.4 lb

## 2015-05-21 DIAGNOSIS — F32A Depression, unspecified: Secondary | ICD-10-CM

## 2015-05-21 DIAGNOSIS — F410 Panic disorder [episodic paroxysmal anxiety] without agoraphobia: Secondary | ICD-10-CM

## 2015-05-21 DIAGNOSIS — F329 Major depressive disorder, single episode, unspecified: Secondary | ICD-10-CM

## 2015-05-21 DIAGNOSIS — G4700 Insomnia, unspecified: Secondary | ICD-10-CM

## 2015-05-21 DIAGNOSIS — F411 Generalized anxiety disorder: Secondary | ICD-10-CM | POA: Diagnosis not present

## 2015-05-21 MED ORDER — ZOLPIDEM TARTRATE 10 MG PO TABS: 10.0000 mg | ORAL_TABLET | Freq: Every evening | ORAL | Status: DC | PRN

## 2015-05-21 NOTE — Progress Notes (Signed)
Chief Complaint:  Chief Complaint  Patient presents with  . Panic Attack    x1 month. Pt. feels overwhelmed and nervous  . Depression    per screening     HPI: Deanna Marshall is a 50 y.o. female who reports to [facility] today complaining of : Depression sxs, she has been on a lot of differrent SSRIs but not helping She is taking care of her mom s/p back surgery which is stressful She is taking care of her brother who is in jail She is not taking herself She feels she is not taking care of herself and this is making her more depressed and stressed She has been without her [place] She does not feel high or manic She has not slept at all in last 2 days.  NExt week , Thursday and Wednesday , therapist on Wednesday and she sees a Psychiatrist on Thursday  She does not feel her anxiety or depression is well controlled with what she is on and would liek to see a different psychiatrist.  She feel sshe has ahd weight loss as well She was + on methadone last UDS in 05/17/2015  Wt Readings from Last 3 Encounters:  05/21/15 149 lb 6.4 oz (67.767 kg)  05/12/15 156 lb 12.8 oz (71.124 kg)  05/10/15 151 lb (68.493 kg)    Chart review:   Patient comes here with a lot of anxiety and depression. She saw her psychiatrist 2 days ago, and was begun on Remeron. She spoke to his office yesterday and they told her to see a PCP. She has an appointment with him in 2 months. She does not feel like she has had good communication, that he has not taken time to get to know her. I advised that that might take some time. She has had trouble finding someone who would take Medicare. She would like a primary care doctor. She smokes. She uses inhalers. She gets exercise about 2 days a week. She helps take care of her 8 year old mother. Her father died 2 months ago which was difficult for her. She had help care for him. Her husband committed suicide about 6 months ago. She used to consider herself to be [facility], but has not  been a part of the church now for some time. She denies drinking alcohol or using substances. She has a history of having been on methadone, but that was post back surgery. She has been on disability for many years. She lives alone in an apartment. She has very few other relationships in her life.  Past Medical History  Diagnosis Date  . Asthma   . Chronic back pain   . Anxiety   . COPD (chronic obstructive pulmonary disease) [facility])    Past Surgical History  Procedure Laterality Date  . Back surgery    . Abdominal hysterectomy    . Insertion of mesh     Social History   Social History  . Marital Status: Widowed    Spouse Name: N/A  . Number of Children: N/A  . Years of Education: N/A   Social History Main Topics  . Smoking status: Current Every Day Smoker -- 0.50 packs/day for 20 years    Types: Cigarettes  . Smokeless tobacco: Never Used  . Alcohol Use: No  . Drug Use: No  . Sexual Activity: Yes    Birth Control/ Protection: Surgical   Other Topics Concern  . None   Social History Narrative   Family History  Problem Relation Age of Onset  . Cancer Mother   . Diabetes Mother   . Hypertension Mother   . Mental illness Mother   . Cancer Father   . Diabetes Other   . Hypertension Other    Allergies  Allergen Reactions  . Compazine [Prochlorperazine] Hives  . Ketorolac Tromethamine Swelling  . Morphine And Related Hives   Prior to Admission medications   Medication Sig Start Date End Date Taking? Authorizing Provider  ALBUTEROL IN Inhale into the lungs.   Yes Historical Provider, MD  clonazePAM (KLONOPIN) 0.5 MG tablet Take maximum of one daily for anxiety only when needed. 05/12/15  Yes Peyton Najjar, MD  ipratropium-albuterol (DUONEB) 0.5-2.5 (3) MG/3ML SOLN Take 3 mLs by nebulization every 6 (six) hours as needed (SOB).    Yes Historical Provider, MD  zolpidem (AMBIEN) 10 MG tablet Take 1 tablet (10 mg total) by mouth at bedtime as needed for sleep. 05/10/15  Yes  Thresa Ross, MD     ROS: The patient denies fevers, chills, night sweats,  chest pain, palpitations, wheezing, dyspnea on exertion, nausea, vomiting, abdominal pain, dysuria, hematuria, melena, numbness, weakness, or tingling.  All other systems have been reviewed and were otherwise negative with the exception of those mentioned in the HPI and as above.    PHYSICAL EXAM: Filed Vitals:   05/21/15 1413  BP: 116/72  Pulse: 80  Temp: 98.2 F (36.8 C)  Resp: 18   Body mass index is 24.86 kg/(m^2).   General: Alert, no acute distress HEENT:  Normocephalic, atraumatic, oropharynx patent. EOMI, PERRLA Cardiovascular:  Regular rate and rhythm, no rubs murmurs or gallops.  No Carotid bruits, radial pulse intact. No pedal edema.  Respiratory: Clear to auscultation bilaterally.  No wheezes, rales, or rhonchi.  No cyanosis, no use of accessory musculature Abdominal: No organomegaly, abdomen is soft and non-tender, positive bowel sounds. No masses. Skin: No rashes. Neurologic: Facial musculature symmetric. Psychiatric: Patient acts appropriately throughout our interaction. Lymphatic: No cervical or submandibular lymphadenopathy Musculoskeletal: Gait intact. No edema, tenderness   LABS: Results for orders placed or performed in visit on 05/17/15  Drug Screen Urine w/Alc, with Conf.  Result Value Ref Range   Benzodiazepines. PPS Negative   Phencyclidine (PCP) NEG Negative   Cocaine Metabolites NEG Negative   Amphetamine Screen, Ur NEG Negative   Marijuana Metabolite NEG Negative   Opiates NEG Negative   Barbiturate Quant, Ur NEG Negative   Methadone PPS Negative   Propoxyphene NEG Negative   Ethyl Alcohol <10 <10 mg/dL   Creatinine,U 161.09 mg/dL  Benzodiazepines (GC/LC/MS), urine  Result Value Ref Range   Alprazolam metabolite (GC/LC/MS), ur confirm     Midazolam (GC/LC/MS), ur confirm     Triazolam metabolite (GC/LC/MS), ur confirm     Clonazepam metabolite (GC/LC/MS), ur  confirm     Flurazepam metabolite (GC/LC/MS), ur confirm     Lorazepam (GC/LC/MS), ur confirm     Nordiazepam (GC/LC/MS), ur confirm     Oxazepam (GC/LC/MS), ur confirm     Temazepam (GC/LC/MS), ur confirm    Methadone (GC/LC/MS), urine  Result Value Ref Range   EDDP (GC/LC/MS), ur confirm     Methadone (GC/LC/MS), ur confirm       EKG/XRAY:   Primary read interpreted by Dr. Conley Rolls at [facility].   ASSESSMENT/PLAN: Encounter Diagnoses  Name Primary?  . Insomnia Yes  . Generalized anxiety disorder   . Panic attacks   . Depression    I gave her a  rx for ambien but only 15 pills She should have had enough from her psychiatrist but she does not Will have to talk with Dr Malachy Chamber, she is tranferring her care to therapist Headen adn psych who is working with him, next appt is in 2 weeks I will see the UDS  Profile when it comes back and see if there is any llicit drug use. [place] controlled substance  DB pulled and she is supposed to be on Lunesta, but she states it does not work, she was given [place] but ran out She has not been [employer] , she wants to try something different, has been on paxil, prozac , seroquel and remeron without relief of depressioon Fu after I speak with psychiatrist and also get results of UDS Denies any SI/HI/hallucinations. Charts reviewed, more than 30 min face to face time was spent with patient in counseling   Gross sideeffects, risk and benefits, and alternatives of medications d/w patient. Patient is aware that all medications have potential sideeffects and we are unable to predict every sideeffect or drug-drug interaction that may occur.  Jari Carollo DO  05/21/2015 3:51 PM

## 2015-05-22 LAB — TSH: TSH: 0.53 mIU/L

## 2015-05-24 LAB — BENZODIAZEPINES (GC/LC/MS), URINE
Alprazolam metabolite (GC/LC/MS), ur confirm: 47 ng/mL — ABNORMAL HIGH (ref <25)
Clonazepam metabolite (GC/LC/MS), ur confirm: 279 ng/mL — ABNORMAL HIGH (ref <25)
Flurazepam metabolite (GC/LC/MS), ur confirm: NEGATIVE ng/mL (ref <50)
Lorazepam (GC/LC/MS), ur confirm: NEGATIVE ng/mL (ref <50)
Midazolam (GC/LC/MS), ur confirm: NEGATIVE ng/mL (ref <50)
Nordiazepam (GC/LC/MS), ur confirm: NEGATIVE ng/mL (ref <50)
Oxazepam (GC/LC/MS), ur confirm: NEGATIVE ng/mL (ref <50)
Temazepam (GC/LC/MS), ur confirm: NEGATIVE ng/mL (ref <50)
Triazolam metabolite (GC/LC/MS), ur confirm: NEGATIVE ng/mL (ref <50)

## 2015-05-24 LAB — METHADONE (GC/LC/MS), URINE
EDDP (GC/LC/MS), ur confirm: 33646 ng/mL — ABNORMAL HIGH (ref <100)
Methadone (GC/LC/MS), ur confirm: 12047 ng/mL — ABNORMAL HIGH (ref <100)

## 2015-05-27 LAB — PRESCRIPTION MONITORING PROFILE (9 PANEL)
Amphetamine/Meth: NEGATIVE ng/mL
Barbiturate Screen, Urine: NEGATIVE ng/mL
Cannabinoid Scrn, Ur: NEGATIVE ng/mL
Cocaine Metabolites: NEGATIVE ng/mL
Creatinine, Urine: 75.77 mg/dL (ref >20.0)
Nitrites, Initial: NEGATIVE ug/mL
Opiate Screen, Urine: NEGATIVE ng/mL
Oxycodone Screen, Ur: NEGATIVE ng/mL
Propoxyphene: NEGATIVE ng/mL
pH, Initial: 8.1 pH (ref 4.5–8.9)

## 2015-05-27 LAB — BENZODIAZEPINES (GC/LC/MS), URINE
Alprazolam metabolite (GC/LC/MS), ur confirm: NEGATIVE ng/mL (ref <25)
Clonazepam metabolite (GC/LC/MS), ur confirm: 63 ng/mL — AB (ref <25)
Flurazepam metabolite (GC/LC/MS), ur confirm: NEGATIVE ng/mL (ref <50)
Lorazepam (GC/LC/MS), ur confirm: NEGATIVE ng/mL (ref <50)
Midazolam (GC/LC/MS), ur confirm: NEGATIVE ng/mL (ref <50)
Nordiazepam (GC/LC/MS), ur confirm: NEGATIVE ng/mL (ref <50)
Oxazepam (GC/LC/MS), ur confirm: NEGATIVE ng/mL (ref <50)
Temazepam (GC/LC/MS), ur confirm: NEGATIVE ng/mL (ref <50)
Triazolam metabolite (GC/LC/MS), ur confirm: NEGATIVE ng/mL (ref <50)

## 2015-05-27 LAB — METHADONE (GC/LC/MS), URINE
EDDP (GC/LC/MS), ur confirm: 6657 ng/mL — AB (ref <100)
Methadone (GC/LC/MS), ur confirm: 207 ng/mL — AB (ref <100)

## 2015-05-28 DIAGNOSIS — T402X1A Poisoning by other opioids, accidental (unintentional), initial encounter: Secondary | ICD-10-CM | POA: Insufficient documentation

## 2015-05-28 DIAGNOSIS — F1320 Sedative, hypnotic or anxiolytic dependence, uncomplicated: Secondary | ICD-10-CM | POA: Insufficient documentation

## 2015-05-29 ENCOUNTER — Ambulatory Visit (HOSPITAL_COMMUNITY): Payer: Self-pay | Admitting: Licensed Clinical Social Worker

## 2015-06-01 ENCOUNTER — Encounter (HOSPITAL_COMMUNITY): Payer: Self-pay | Admitting: Psychiatry

## 2015-06-01 ENCOUNTER — Telehealth (HOSPITAL_COMMUNITY): Payer: Self-pay | Admitting: *Deleted

## 2015-06-01 NOTE — Telephone Encounter (Signed)
Printing and sending a discharge letter for administrative reasons and non complliance. She is receiving and seeking multiple prescriptions from different providers.

## 2015-06-01 NOTE — Telephone Encounter (Signed)
Received telephone call from [place] at [facility]. Per pharmacy, pt is receiving multiple prescriptions from different doctors for Luesta, and Ambien.

## 2015-06-01 NOTE — Telephone Encounter (Signed)
Will discharge patient as she is not following with our recommendations and compliance and remains drug seeking from different providers.

## 2015-06-01 NOTE — Telephone Encounter (Signed)
[facility] CALLED   This patient is drug seeking, she is going to many providers for controlled substances.   [phone]

## 2015-06-02 NOTE — Telephone Encounter (Signed)
I will route this to both Drs. Le and Alwyn Ren since they both saw this pt this month. FYI.

## 2015-06-05 NOTE — Telephone Encounter (Signed)
Noted the report from her other doctor she was being released due to noncompliance, etc.  We will need to take careful note if she returns here.

## 2015-06-21 ENCOUNTER — Encounter: Payer: Self-pay | Admitting: Family Medicine

## 2015-06-27 ENCOUNTER — Ambulatory Visit (HOSPITAL_COMMUNITY): Payer: Self-pay | Admitting: Psychiatry

## 2015-07-02 ENCOUNTER — Ambulatory Visit (INDEPENDENT_AMBULATORY_CARE_PROVIDER_SITE_OTHER): Payer: Medicare Other | Admitting: Family Medicine

## 2015-07-02 ENCOUNTER — Ambulatory Visit (INDEPENDENT_AMBULATORY_CARE_PROVIDER_SITE_OTHER): Payer: Medicare Other

## 2015-07-02 VITALS — BP 112/70 | HR 72 | Temp 98.2°F | Resp 18 | Ht 64.5 in | Wt 152.4 lb

## 2015-07-02 DIAGNOSIS — G4700 Insomnia, unspecified: Secondary | ICD-10-CM

## 2015-07-02 DIAGNOSIS — M545 Low back pain, unspecified: Secondary | ICD-10-CM

## 2015-07-02 DIAGNOSIS — K5900 Constipation, unspecified: Secondary | ICD-10-CM

## 2015-07-02 DIAGNOSIS — R3129 Other microscopic hematuria: Secondary | ICD-10-CM | POA: Diagnosis not present

## 2015-07-02 DIAGNOSIS — R350 Frequency of micturition: Secondary | ICD-10-CM | POA: Diagnosis not present

## 2015-07-02 LAB — POCT URINALYSIS DIP (MANUAL ENTRY)
Bilirubin, UA: NEGATIVE
Glucose, UA: NEGATIVE
Leukocytes, UA: NEGATIVE
Nitrite, UA: NEGATIVE
Spec Grav, UA: 1.02
Urobilinogen, UA: 0.2
pH, UA: 8.5

## 2015-07-02 LAB — POC MICROSCOPIC URINALYSIS (UMFC)
Amorphous: POSITIVE
Mucus: ABSENT

## 2015-07-02 MED ORDER — METHOCARBAMOL 500 MG PO TABS: 500.0000 mg | ORAL_TABLET | Freq: Four times a day (QID) | ORAL | Status: DC

## 2015-07-02 MED ORDER — PHENAZOPYRIDINE HCL 200 MG PO TABS: 200.0000 mg | ORAL_TABLET | Freq: Three times a day (TID) | ORAL | Status: DC | PRN

## 2015-07-02 MED ORDER — POLYETHYLENE GLYCOL 3350 17 GM/SCOOP PO POWD: 17.0000 g | Freq: Every day | ORAL | Status: DC

## 2015-07-02 MED ORDER — NAPROXEN 500 MG PO TABS: 500.0000 mg | ORAL_TABLET | Freq: Two times a day (BID) | ORAL | Status: DC

## 2015-07-02 MED ORDER — CYCLOBENZAPRINE HCL 10 MG PO TABS: 10.0000 mg | ORAL_TABLET | Freq: Every day | ORAL | Status: DC
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
## 2015-07-02 NOTE — Patient Instructions (Addendum)
IF you received an x-ray today, you will receive an invoice from [facility]. Please contact [facility] at [phone] with questions or concerns regarding your invoice.   IF you received labwork today, you will receive an invoice from [facility]. Please contact [facility] at [phone] with questions or concerns regarding your invoice.   Our billing staff will not be able to assist you with questions regarding bills from these companies.  You will be contacted with the lab results as soon as they are available. The fastest way to get your results is to activate your My Chart account. Instructions are located on the last page of this paperwork. If you have not heard from us regarding the results in 2 weeks, please contact this office.    Back Pain, Adult Back pain is very common in adults.The cause of back pain is rarely dangerous and the pain often gets better over time.The cause of your back pain may not be known. Some common causes of back pain include:  Strain of the muscles or ligaments supporting the spine.  Wear and tear (degeneration) of the spinal disks.  Arthritis.  Direct injury to the back. For many people, back pain may return. Since back pain is rarely dangerous, most people can learn to manage this condition on their own. HOME CARE INSTRUCTIONS Watch your back pain for any changes. The following actions may help to lessen any discomfort you are feeling:  Remain active. It is stressful on your back to sit or stand in one place for long periods of time. Do not sit, drive, or stand in one place for more than 30 minutes at a time. Take short walks on even surfaces as soon as you are able.Try to increase the length of time you walk each day.  Exercise regularly as directed by your health care provider. Exercise helps your back heal faster. It also helps avoid future injury by keeping your muscles strong and flexible.  Do  not stay in bed.Resting more than 1-2 days can delay your recovery.  Pay attention to your body when you bend and lift. The most comfortable positions are those that put less stress on your recovering back. Always use proper lifting techniques, including:  Bending your knees.  Keeping the load close to your body.  Avoiding twisting.  Find a comfortable position to sleep. Use a firm mattress and lie on your side with your knees slightly bent. If you lie on your back, put a pillow under your knees.  Avoid feeling anxious or stressed.Stress increases muscle tension and can worsen back pain.It is important to recognize when you are anxious or stressed and learn ways to manage it, such as with exercise.  Take medicines only as directed by your health care provider. Over-the-counter medicines to reduce pain and inflammation are often the most helpful.Your health care provider may prescribe muscle relaxant drugs.These medicines help dull your pain so you can more quickly return to your normal activities and healthy exercise.  Apply ice to the injured area:  Put ice in a plastic bag.  Place a towel between your skin and the bag.  Leave the ice on for 20 minutes, 2-3 times a day for the first 2-3 days. After that, ice and heat may be alternated to reduce pain and spasms.  Maintain a healthy weight. Excess weight puts extra stress on your back and makes it difficult to maintain good posture. SEEK MEDICAL CARE IF:  You have pain  that is not relieved with rest or medicine.  You have increasing pain going down into the legs or buttocks.  You have pain that does not improve in one week.  You have night pain.  You lose weight.  You have a fever or chills. SEEK IMMEDIATE MEDICAL CARE IF:   You develop new bowel or bladder control problems.  You have unusual weakness or numbness in your arms or legs.  You develop nausea or vomiting.  You develop abdominal pain.  You feel faint.    This information is not intended to replace advice given to you by your health care provider. Make sure you discuss any questions you have with your health care provider.   Document Released: 03/25/2005 Document Revised: 04/15/2014 Document Reviewed: 07/27/2013 Elsevier Interactive Patient Education 2016 [employer].   Constipation, Adult Constipation is when a person has fewer than three bowel movements a week, has difficulty having a bowel movement, or has stools that are dry, hard, or larger than normal. As people grow older, constipation is more common. A low-fiber diet, not taking in enough fluids, and taking certain medicines may make constipation worse.  CAUSES   Certain medicines, such as antidepressants, pain medicine, iron supplements, antacids, and water pills.   Certain diseases, such as diabetes, irritable bowel syndrome (IBS), thyroid disease, or depression.   Not drinking enough water.   Not eating enough fiber-rich foods.   Stress or travel.   Lack of physical activity or exercise.   Ignoring the urge to have a bowel movement.   Using laxatives too much.  SIGNS AND SYMPTOMS   Having fewer than three bowel movements a week.   Straining to have a bowel movement.   Having stools that are hard, dry, or larger than normal.   Feeling full or bloated.   Pain in the lower abdomen.   Not feeling relief after having a bowel movement.  DIAGNOSIS  Your health care provider will take a medical history and perform a physical exam. Further testing may be done for severe constipation. Some tests may include:  A barium enema X-ray to examine your rectum, colon, and, sometimes, your small intestine.   A sigmoidoscopy to examine your lower colon.   A colonoscopy to examine your entire colon. TREATMENT  Treatment will depend on the severity of your constipation and what is causing it. Some dietary treatments include drinking more fluids and eating more  fiber-rich foods. Lifestyle treatments may include regular exercise. If these diet and lifestyle recommendations do not help, your health care provider may recommend taking over-the-counter laxative medicines to help you have bowel movements. Prescription medicines may be prescribed if over-the-counter medicines do not work.  HOME CARE INSTRUCTIONS   Eat foods that have a lot of fiber, such as fruits, vegetables, whole grains, and beans.  Limit foods high in fat and processed sugars, such as french fries, hamburgers, cookies, candies, and soda.   A fiber supplement may be added to your diet if you cannot get enough fiber from foods.   Drink enough fluids to keep your urine clear or pale yellow.   Exercise regularly or as directed by your health care provider.   Go to the restroom when you have the urge to go. Do not hold it.   Only take over-the-counter or prescription medicines as directed by your health care provider. Do not take other medicines for constipation without talking to your health care provider first.  SEEK IMMEDIATE MEDICAL CARE IF:  You have bright red blood in your stool.   Your constipation lasts for more than 4 days or gets worse.   You have abdominal or rectal pain.   You have thin, pencil-like stools.   You have unexplained weight loss. MAKE SURE YOU:   Understand these instructions.  Will watch your condition.  Will get help right away if you are not doing well or get worse.   This information is not intended to replace advice given to you by your health care provider. Make sure you discuss any questions you have with your health care provider.   Document Released: 12/22/2003 Document Revised: 04/15/2014 Document Reviewed: 01/04/2013 Elsevier Interactive Patient Education 2016 Else[employer] Inc. Hematuria, Adult Hematuria is blood in your urine. It can be caused by a bladder infection, kidney infection, prostate infection, kidney stone, or cancer  of your urinary tract. Infections can usually be treated with medicine, and a kidney stone usually will pass through your urine. If neither of these is the cause of your hematuria, further workup to find out the reason may be needed. It is very important that you tell your health care provider about any blood you see in your urine, even if the blood stops without treatment or happens without causing pain. Blood in your urine that happens and then stops and then happens again can be a symptom of a very serious condition. Also, pain is not a symptom in the initial stages of many urinary cancers. HOME CARE INSTRUCTIONS   Drink lots of fluid, 3-4 quarts a day. If you have been diagnosed with an infection, cranberry juice is especially recommended, in addition to large amounts of water.  Avoid caffeine, tea, and carbonated beverages because they tend to irritate the bladder.  Avoid alcohol because it may irritate the prostate.  Take all medicines as directed by your health care provider.  If you were prescribed an antibiotic medicine, finish it all even if you start to feel better.  If you have been diagnosed with a kidney stone, follow your health care provider's instructions regarding straining your urine to catch the stone.  Empty your bladder often. Avoid holding urine for long periods of time.  After a bowel movement, women should cleanse front to back. Use each tissue only once.  Empty your bladder before and after sexual intercourse if you are a female. SEEK MEDICAL CARE IF:  You develop back pain.  You have a fever.  You have a feeling of sickness in your stomach (nausea) or vomiting.  Your symptoms are not better in 3 days. Return sooner if you are getting worse. SEEK IMMEDIATE MEDICAL CARE IF:   You develop severe vomiting and are unable to keep the medicine down.  You develop severe back or abdominal pain despite taking your medicines.  You begin passing a large amount of  blood or clots in your urine.  You feel extremely weak or faint, or you pass out. MAKE SURE YOU:   Understand these instructions.  Will watch your condition.  Will get help right away if you are not doing well or get worse.   This information is not intended to replace advice given to you by your health care provider. Make sure you discuss any questions you have with your health care provider.   Document Released: 03/25/2005 Document Revised: 04/15/2014 Document Reviewed: 11/23/2012 Elsevier Interactive Patient Education [employer]

## 2015-07-04 LAB — URINE CULTURE
Colony Count: NO GROWTH
Organism ID, Bacteria: NO GROWTH

## 2015-08-02 ENCOUNTER — Encounter (HOSPITAL_COMMUNITY): Payer: Self-pay | Admitting: Vascular Surgery

## 2015-08-02 ENCOUNTER — Emergency Department (HOSPITAL_COMMUNITY): Payer: Medicare Other

## 2015-08-02 DIAGNOSIS — F17210 Nicotine dependence, cigarettes, uncomplicated: Secondary | ICD-10-CM | POA: Diagnosis not present

## 2015-08-02 DIAGNOSIS — R1110 Vomiting, unspecified: Secondary | ICD-10-CM | POA: Insufficient documentation

## 2015-08-02 DIAGNOSIS — G8929 Other chronic pain: Secondary | ICD-10-CM | POA: Insufficient documentation

## 2015-08-02 DIAGNOSIS — J441 Chronic obstructive pulmonary disease with (acute) exacerbation: Secondary | ICD-10-CM | POA: Diagnosis not present

## 2015-08-02 DIAGNOSIS — R0602 Shortness of breath: Secondary | ICD-10-CM | POA: Diagnosis present

## 2015-08-02 LAB — CBC
HCT: 45.1 % (ref 36.0–46.0)
Hemoglobin: 15 g/dL (ref 12.0–15.0)
MCH: 30.4 pg (ref 26.0–34.0)
MCHC: 33.3 g/dL (ref 30.0–36.0)
MCV: 91.3 fL (ref 78.0–100.0)
Platelets: 285 10*3/uL (ref 150–400)
RBC: 4.94 MIL/uL (ref 3.87–5.11)
RDW: 12.8 % (ref 11.5–15.5)
WBC: 8.5 10*3/uL (ref 4.0–10.5)

## 2015-08-02 LAB — BASIC METABOLIC PANEL
Anion gap: 10 (ref 5–15)
BUN: 17 mg/dL (ref 6–20)
CO2: 27 mmol/L (ref 22–32)
Calcium: 9.2 mg/dL (ref 8.9–10.3)
Chloride: 106 mmol/L (ref 101–111)
Creatinine, Ser: 0.81 mg/dL (ref 0.44–1.00)
GFR calc Af Amer: >60 mL/min (ref >60)
GFR calc non Af Amer: >60 mL/min (ref >60)
Glucose, Bld: 84 mg/dL (ref 65–99)
Potassium: 4.1 mmol/L (ref 3.5–5.1)
Sodium: 143 mmol/L (ref 135–145)

## 2015-08-02 LAB — I-STAT TROPONIN, ED: Troponin i, poc: 0 ng/mL (ref 0.00–0.08)

## 2015-08-02 NOTE — ED Notes (Signed)
Pt reports to the ED for eval of SOB, emesis with taking her prescribed medications, anxiety, and just overall not feeling well. She has been having the SOB x several weeks. She was recently dx with bronchitis (2 weeks ago, given steriods, cough meds, ect) and PNA (1 month ago and was tx with abx). Continues to have a cough. Pt also reports she recently lost her daughter and that this may be r/t anxiety as well. Pt A&Ox4, resp e/u, and skin warm and dry.

## 2015-08-03 ENCOUNTER — Emergency Department (HOSPITAL_COMMUNITY)
Admission: EM | Admit: 2015-08-03 | Discharge: 2015-08-03 | Disposition: A | Discharge status: Home / Self Care | Payer: Medicare Other | Attending: Emergency Medicine | Admitting: Emergency Medicine

## 2015-08-03 NOTE — ED Notes (Signed)
No answer in waiting area.

## 2015-08-06 ENCOUNTER — Ambulatory Visit (INDEPENDENT_AMBULATORY_CARE_PROVIDER_SITE_OTHER): Payer: Medicare Other | Admitting: Urgent Care

## 2015-08-06 VITALS — BP 124/64 | HR 86 | Temp 99.2°F | Resp 16 | Ht 64.5 in | Wt 145.0 lb

## 2015-08-06 DIAGNOSIS — R05 Cough: Secondary | ICD-10-CM

## 2015-08-06 DIAGNOSIS — R112 Nausea with vomiting, unspecified: Secondary | ICD-10-CM | POA: Diagnosis not present

## 2015-08-06 DIAGNOSIS — G4700 Insomnia, unspecified: Secondary | ICD-10-CM

## 2015-08-06 DIAGNOSIS — R062 Wheezing: Secondary | ICD-10-CM

## 2015-08-06 DIAGNOSIS — J4540 Moderate persistent asthma, uncomplicated: Secondary | ICD-10-CM | POA: Diagnosis not present

## 2015-08-06 DIAGNOSIS — R059 Cough, unspecified: Secondary | ICD-10-CM

## 2015-08-06 DIAGNOSIS — F32A Depression, unspecified: Secondary | ICD-10-CM

## 2015-08-06 DIAGNOSIS — F418 Other specified anxiety disorders: Secondary | ICD-10-CM | POA: Diagnosis not present

## 2015-08-06 DIAGNOSIS — F329 Major depressive disorder, single episode, unspecified: Secondary | ICD-10-CM

## 2015-08-06 DIAGNOSIS — F419 Anxiety disorder, unspecified: Secondary | ICD-10-CM

## 2015-08-06 MED ORDER — ESCITALOPRAM OXALATE 10 MG PO TABS: ORAL_TABLET | ORAL | Status: DC

## 2015-08-06 MED ORDER — ALPRAZOLAM 1 MG PO TABS: 1.0000 mg | ORAL_TABLET | Freq: Every evening | ORAL | Status: DC | PRN

## 2015-08-06 MED ORDER — IPRATROPIUM BROMIDE 0.02 % IN SOLN
0.5000 mg | Freq: Once | RESPIRATORY_TRACT | Status: AC
  Administered 2015-08-06: 0.5 mg via RESPIRATORY_TRACT

## 2015-08-06 MED ORDER — ONDANSETRON 8 MG PO TBDP: 8.0000 mg | ORAL_TABLET | Freq: Three times a day (TID) | ORAL | Status: DC | PRN

## 2015-08-06 MED ORDER — ALBUTEROL SULFATE (2.5 MG/3ML) 0.083% IN NEBU
2.5000 mg | INHALATION_SOLUTION | Freq: Once | RESPIRATORY_TRACT | Status: AC
  Administered 2015-08-06: 2.5 mg via RESPIRATORY_TRACT

## 2015-08-06 MED ORDER — BECLOMETHASONE DIPROPIONATE 80 MCG/ACT IN AERS: 2.0000 | INHALATION_SPRAY | Freq: Two times a day (BID) | RESPIRATORY_TRACT | Status: DC

## 2015-08-06 NOTE — Patient Instructions (Addendum)
Asthma, Adult Asthma is a recurring condition in which the airways tighten and narrow. Asthma can make it difficult to breathe. It can cause coughing, wheezing, and shortness of breath. Asthma episodes, also called asthma attacks, range from minor to life-threatening. Asthma cannot be cured, but medicines and lifestyle changes can help control it. CAUSES Asthma is believed to be caused by inherited (genetic) and environmental factors, but its exact cause is unknown. Asthma may be triggered by allergens, lung infections, or irritants in the air. Asthma triggers are different for each person. Common triggers include:   Animal dander.  Dust mites.  Cockroaches.  Pollen from trees or grass.  Mold.  Smoke.  Air pollutants such as dust, household cleaners, hair sprays, aerosol sprays, paint fumes, strong chemicals, or strong odors.  Cold air, weather changes, and winds (which increase molds and pollens in the air).  Strong emotional expressions such as crying or laughing hard.  Stress.  Certain medicines (such as aspirin) or types of drugs (such as beta-blockers).  Sulfites in foods and drinks. Foods and drinks that may contain sulfites include dried fruit, potato chips, and sparkling grape juice.  Infections or inflammatory conditions such as the flu, a cold, or an inflammation of the nasal membranes (rhinitis).  Gastroesophageal reflux disease (GERD).  Exercise or strenuous activity. SYMPTOMS Symptoms may occur immediately after asthma is triggered or many hours later. Symptoms include:  Wheezing.  Excessive nighttime or early morning coughing.  Frequent or severe coughing with a common cold.  Chest tightness.  Shortness of breath. DIAGNOSIS  The diagnosis of asthma is made by a review of your medical history and a physical exam. Tests may also be performed. These may include:  Lung function studies. These tests show how much air you breathe in and out.  Allergy  tests.  Imaging tests such as X-rays. TREATMENT  Asthma cannot be cured, but it can usually be controlled. Treatment involves identifying and avoiding your asthma triggers. It also involves medicines. There are 2 classes of medicine used for asthma treatment:   Controller medicines. These prevent asthma symptoms from occurring. They are usually taken every day.  Reliever or rescue medicines. These quickly relieve asthma symptoms. They are used as needed and provide short-term relief. Your health care provider will help you create an asthma action plan. An asthma action plan is a written plan for managing and treating your asthma attacks. It includes a list of your asthma triggers and how they may be avoided. It also includes information on when medicines should be taken and when their dosage should be changed. An action plan may also involve the use of a device called a peak flow meter. A peak flow meter measures how well the lungs are working. It helps you monitor your condition. HOME CARE INSTRUCTIONS   Take medicines only as directed by your health care provider. Speak with your health care provider if you have questions about how or when to take the medicines.  Use a peak flow meter as directed by your health care provider. Record and keep track of readings.  Understand and use the action plan to help minimize or stop an asthma attack without needing to seek medical care.  Control your home environment in the following ways to help prevent asthma attacks:  Do not smoke. Avoid being exposed to secondhand smoke.  Change your heating and air conditioning filter regularly.  Limit your use of fireplaces and wood stoves.  Get rid of pests (such as roaches   and mice) and their droppings.  Throw away plants if you see mold on them.  Clean your floors and dust regularly. Use unscented cleaning products.  Try to have someone else vacuum for you regularly. Stay out of rooms while they are  being vacuumed and for a short while afterward. If you vacuum, use a dust mask from a hardware store, a double-layered or microfilter vacuum cleaner bag, or a vacuum cleaner with a HEPA filter.  Replace carpet with wood, tile, or vinyl flooring. Carpet can trap dander and dust.  Use allergy-proof pillows, mattress covers, and box spring covers.  Wash bed sheets and blankets every week in hot water and dry them in a dryer.  Use blankets that are made of polyester or cotton.  Clean bathrooms and kitchens with bleach. If possible, have someone repaint the walls in these rooms with mold-resistant paint. Keep out of the rooms that are being cleaned and painted.  Wash hands frequently. SEEK MEDICAL CARE IF:   You have wheezing, shortness of breath, or a cough even if taking medicine to prevent attacks.  The colored mucus you cough up (sputum) is thicker than usual.  Your sputum changes from clear or white to yellow, green, gray, or bloody.  You have any problems that may be related to the medicines you are taking (such as a rash, itching, swelling, or trouble breathing).  You are using a reliever medicine more than 2-3 times per week.  Your peak flow is still at 50-79% of your personal best after following your action plan for 1 hour.  You have a fever. SEEK IMMEDIATE MEDICAL CARE IF:   You seem to be getting worse and are unresponsive to treatment during an asthma attack.  You are short of breath even at rest.  You get short of breath when doing very little physical activity.  You have difficulty eating, drinking, or talking due to asthma symptoms.  You develop chest pain.  You develop a fast heartbeat.  You have a bluish color to your lips or fingernails.  You are light-headed, dizzy, or faint.  Your peak flow is less than 50% of your personal best.   This information is not intended to replace advice given to you by your health care provider. Make sure you discuss any  questions you have with your health care provider.   Document Released: 03/25/2005 Document Revised: 12/14/2014 Document Reviewed: 10/22/2012 Elsevier Interactive Patient Education 2016 Elsevier Inc.    Generalized Anxiety Disorder Generalized anxiety disorder (GAD) is a mental disorder. It interferes with life functions, including relationships, work, and school. GAD is different from normal anxiety, which everyone experiences at some point in their lives in response to specific life events and activities. Normal anxiety actually helps [place] prepare for and get through these life events and activities. Normal anxiety goes away after the event or activity is over.  GAD causes anxiety that is not necessarily related to specific events or activities. It also causes excess anxiety in proportion to specific events or activities. The anxiety associated with GAD is also difficult to control. GAD can vary from mild to severe. People with severe GAD can have intense waves of anxiety with physical symptoms (panic attacks).  SYMPTOMS The anxiety and worry associated with GAD are difficult to control. This anxiety and worry are related to many life events and activities and also occur more days than not for 6 months or longer. People with GAD also have three or more of the following  symptoms (one or more in children):  Restlessness.   Fatigue.  Difficulty concentrating.   Irritability.  Muscle tension.  Difficulty sleeping or unsatisfying sleep. DIAGNOSIS GAD is diagnosed through an assessment by your health care provider. Your health care provider will ask you questions aboutyour mood,physical symptoms, and events in your life. Your health care provider may ask you about your medical history and use of alcohol or drugs, including prescription medicines. Your health care provider may also do a physical exam and blood tests. Certain medical conditions and the use of certain substances can cause  symptoms similar to those associated with GAD. Your health care provider may refer you to a mental health specialist for further evaluation. TREATMENT The following therapies are usually used to treat GAD:   Medication. Antidepressant medication usually is prescribed for long-term daily control. Antianxiety medicines may be added in severe cases, especially when panic attacks occur.   Talk therapy (psychotherapy). Certain types of talk therapy can be helpful in treating GAD by providing support, education, and guidance. A form of talk therapy called cognitive behavioral therapy can teach you healthy ways to think about and react to daily life events and activities.  Stress managementtechniques. These include yoga, meditation, and exercise and can be very helpful when they are practiced regularly. A mental health specialist can help determine which treatment is best for you. Some people see improvement with one therapy. However, other people require a combination of therapies.   This information is not intended to replace advice given to you by your health care provider. Make sure you discuss any questions you have with your health care provider.   Document Released: 07/20/2012 Document Revised: 04/15/2014 Document Reviewed: 07/20/2012 Elsevier Interactive Patient Education 2016 Elsevier Inc.    Major Depressive Disorder Major depressive disorder is a mental illness. It also may be called clinical depression or unipolar depression. Major depressive disorder usually causes feelings of sadness, hopelessness, or helplessness. Some people with this disorder do not feel particularly sad but lose interest in doing things they used to enjoy (anhedonia). Major depressive disorder also can cause physical symptoms. It can interfere with work, school, relationships, and other normal everyday activities. The disorder varies in severity but is longer lasting and more serious than the sadness we all feel from  time to time in our lives. Major depressive disorder often is triggered by stressful life events or major life changes. Examples of these triggers include divorce, loss of your job or home, a move, and the death of a family member or close friend. Sometimes this disorder occurs for no obvious reason at all. People who have family members with major depressive disorder or bipolar disorder are at higher risk for developing this disorder, with or without life stressors. Major depressive disorder can occur at any age. It may occur just once in your life (single episode major depressive disorder). It may occur multiple times (recurrent major depressive disorder). SYMPTOMS People with major depressive disorder have either anhedonia or depressed mood on nearly a daily basis for at least 2 weeks or longer. Symptoms of depressed mood include:  Feelings of sadness (blue or down in the dumps) or emptiness.  Feelings of hopelessness or helplessness.  Tearfulness or episodes of crying (may be observed by others).  Irritability (children and adolescents). In addition to depressed mood or anhedonia or both, people with this disorder have at least four of the following symptoms:  Difficulty sleeping or sleeping too much.   Significant change (increase or  decrease) in appetite or weight.   Lack of energy or motivation.  Feelings of guilt and worthlessness.   Difficulty concentrating, remembering, or making decisions.  Unusually slow movement (psychomotor retardation) or restlessness (as observed by others).   Recurrent wishes for death, recurrent thoughts of self-harm (suicide), or a suicide attempt. People with major depressive disorder commonly have persistent negative thoughts about themselves, other people, and the world. People with severe major depressive disorder may experiencedistorted beliefs or perceptions about the world (psychotic delusions). They also may see or hear things that are not  real (psychotic hallucinations). DIAGNOSIS Major depressive disorder is diagnosed through an assessment by your health care provider. Your health care provider will ask aboutaspects of your daily life, such as mood,sleep, and appetite, to see if you have the diagnostic symptoms of major depressive disorder. Your health care provider may ask about your medical history and use of alcohol or drugs, including prescription medicines. Your health care provider also may do a physical exam and blood work. This is because certain medical conditions and the use of certain substances can cause major depressive disorder-like symptoms (secondary depression). Your health care provider also may refer you to a mental health specialist for further evaluation and treatment. TREATMENT It is important to recognize the symptoms of major depressive disorder and seek treatment. The following treatments can be prescribed for this disorder:   Medicine. Antidepressant medicines usually are prescribed. Antidepressant medicines are thought to correct chemical imbalances in the brain that are commonly associated with major depressive disorder. Other types of medicine may be added if the symptoms do not respond to antidepressant medicines alone or if psychotic delusions or hallucinations occur.  Talk therapy. Talk therapy can be helpful in treating major depressive disorder by providing support, education, and guidance. Certain types of talk therapy also can help with negative thinking (cognitive behavioral therapy) and with relationship issues that trigger this disorder (interpersonal therapy). A mental health specialist can help determine which treatment is best for you. Most people with major depressive disorder do well with a combination of medicine and talk therapy. Treatments involving electrical stimulation of the brain can be used in situations with extremely severe symptoms or when medicine and talk therapy do not work over  time. These treatments include electroconvulsive therapy, transcranial magnetic stimulation, and vagal nerve stimulation.   This information is not intended to replace advice given to you by your health care provider. Make sure you discuss any questions you have with your health care provider.   Document Released: 07/20/2012 Document Revised: 04/15/2014 Document Reviewed: 07/20/2012 Elsevier Interactive Patient Education [employer]2016 Elsevier Inc.     IF you received an x-ray today, you will receive an [facility] from [facility]. Please contact [facility] at [phone] with questions or concerns regarding your invoice.   IF you received labwork today, you will receive an invoice from [facility]. Please contact [facility] at [phone] with questions or concerns regarding your invoice.   Our billing staff will not be able to assist you with questions regarding bills from these companies.  You will be contacted with the lab results as soon as they are available. The fastest way to get your results is to activate your My Chart account. Instructions are located on the last page of this paperwork. If you have not heard from us regarding the results in 2 weeks, please contact this office.

## 2015-08-06 NOTE — Progress Notes (Signed)
MRN: [id number] DOB: 08/05/1965  Subjective:   Deanna Marshall is a 50 y.o. female presenting for chief complaint of Cough; Emesis; and Fatigue  Anxiety/Depression - Reports that she is under a lot of pressure. Feels overwhelmed with her health including uncontrolled asthma. Patient is going to see Dr. George Ina, psychiatrist, in 2 weeks. She has previously failed Buspar and Zoloft, has also taken a different SSRI which she cannot remember. Her husband committed suicide and her father passed away in the past 2 years. She also takes care of her mother who is not in good health. Patient has had significant difficulty sleeping. Her PCP Dr. Wilmer Floor in [place] had prescribed her Ambien  and Xanax  which she has used but causes her significant nausea. She has been trying Zofran but with minimal relief. She has not yet considered being on an SSRI again. She is requesting medication refills to help her with sleep. Denies thoughts about death or [place].  Asthma - Uses nebulizer twice a night, albuterol inhaler 3 times per day. Admits daily wheezing and shob worst at night. She was supposed to be taking an inhaled steroid but could not afford the one that was prescribed. She denies fever, chest pain, night sweats. Denies smoking cigarettes.  Anneliese has a current medication list which includes the following prescription(s): albuterol, ipratropium-albuterol, naproxen, polyethylene glycol powder, and alprazolam. Also is allergic to compazine; ketorolac tromethamine; and morphine and related.  Rakia  has a past medical history of Asthma; Chronic back pain; Anxiety; and COPD (chronic obstructive pulmonary disease) (HCC). Also  has past surgical history that includes Back surgery; Abdominal hysterectomy; and Insertion of mesh.  Objective:   Vitals: BP 124/64 mmHg  Pulse 86  Temp(Src) 99.2 F (37.3 C) (Oral)  Resp 16  Ht 5' 4.5" (1.638 m)  Wt 145 lb (65.772 kg)  BMI 24.51 kg/m2  SpO2 100%  Wt  Readings from Last 3 Encounters:  08/06/15 145 lb (65.772 kg)  07/02/15 152 lb 6 oz (69.117 kg)  05/21/15 149 lb 6.4 oz (67.767 kg)   Physical Exam  Constitutional: She is oriented to person, place, and time. She appears well-developed and well-nourished.  HENT:  Mouth/Throat: Oropharynx is clear and moist.  Eyes: Pupils are equal, round, and reactive to light. No scleral icterus.  Neck: Normal range of motion. Neck supple. No thyromegaly present.  Cardiovascular: Normal rate, regular rhythm and intact distal pulses.  Exam reveals no gallop and no friction rub.   No murmur heard. Pulmonary/Chest: No respiratory distress. She has wheezes (throughout). She has no rales.  Lymphadenopathy:    She has no cervical adenopathy.  Neurological: She is alert and oriented to person, place, and time.  Skin: Skin is warm and dry.  Psychiatric: Her mood appears anxious. She exhibits a depressed mood (flat affect, tearful). She expresses no homicidal and no suicidal ideation.   Assessment and Plan :   1. Asthma, moderate persistent, uncomplicated 2. Wheezing 3. Cough - Patient had significant improvement in her wheezing s/p duo-neb treatment. I counseled patient on use of albuterol at night and the fact that it may affect her sleep. She is to avoid using albuterol at night unless necessary. Patient verbalized understanding. I also offered her Qvar BID. I advised she start this for her asthma but it may prove too much of a cost burden. Patient is awaiting referral to Pulmonology for further management.   4. Anxiety and depression - Discussed diagnosis with patient and need for  maintenance therapy. Offered patient script for Lexapro, she will consider this but prefers to start treatment with her psychiatrist. She is not considering therapy at the moment.  5. Insomnia - Refilled 1mg  Xanax until she can see her psychiatrist.  6. Nausea and vomiting, intractability of vomiting not specified,  unspecified vomiting type - Counseled on use of 2 benzodiazepines, this may have contributed to her nausea. Continue using Zofran prn.  Wallis BambergMario Rileigh Kawashima, PA-C [facility] [phone] 08/06/2015 3:19 PM

## 2015-08-13 ENCOUNTER — Encounter: Payer: Self-pay | Admitting: Emergency Medicine

## 2015-08-13 ENCOUNTER — Emergency Department
Admission: EM | Admit: 2015-08-13 | Discharge: 2015-08-13 | Disposition: A | Discharge status: Home / Self Care | Payer: Medicare Other | Attending: Student | Admitting: Student

## 2015-08-13 DIAGNOSIS — J449 Chronic obstructive pulmonary disease, unspecified: Secondary | ICD-10-CM | POA: Diagnosis not present

## 2015-08-13 DIAGNOSIS — M545 Low back pain: Secondary | ICD-10-CM | POA: Diagnosis present

## 2015-08-13 DIAGNOSIS — F17210 Nicotine dependence, cigarettes, uncomplicated: Secondary | ICD-10-CM | POA: Insufficient documentation

## 2015-08-13 DIAGNOSIS — N390 Urinary tract infection, site not specified: Secondary | ICD-10-CM | POA: Insufficient documentation

## 2015-08-13 DIAGNOSIS — J45909 Unspecified asthma, uncomplicated: Secondary | ICD-10-CM | POA: Diagnosis not present

## 2015-08-13 HISTORY — DX: Calculus of kidney: N20.0

## 2015-08-13 MED ORDER — ONDANSETRON HCL 4 MG PO TABS: 4.0000 mg | ORAL_TABLET | Freq: Three times a day (TID) | ORAL | Status: DC | PRN

## 2015-08-13 MED ORDER — CEPHALEXIN 500 MG PO CAPS: 500.0000 mg | ORAL_CAPSULE | Freq: Two times a day (BID) | ORAL | Status: DC

## 2015-08-13 NOTE — ED Notes (Addendum)
Pt c/o back pain and vaginal pain for about 3 weeks; pt says she has known kidney stones which she is supposed to have lithotripsy for on Thursday; pt says she was supposed to have the procedure twice before but was unable to make the appts (pt cares for her elderly mother); pt c/o N/V; pt c/o urinary retention; chills but has not checked temp at home; pt says she was prescribed Oxycontin for her pain, which relieves it, but she is out

## 2015-08-13 NOTE — Discharge Instructions (Signed)
Do not take the Bactrim prescribed to you at [facility]. Take the Keflex that I am prescribing you instead.

## 2015-08-13 NOTE — ED Notes (Addendum)
Patient states that she was diagnosed with kidney stones 3 weeks ago, has missed 2 appointments for lithotripsy due to having to take care of her mother. Patient was seen by Dr. Adriana Simasook last week and had an x-ray taken that showed the stones were in the same location. Patient is also c/o nausea at this time.  Patient reports that she was given OxyContin for the pain but she is now out.

## 2015-08-13 NOTE — ED Provider Notes (Signed)
[facility] Emergency Department Provider Note   ____________________________________________  Time seen: Approximately 7:07 AM  I have reviewed the triage vital signs and the nursing notes.   HISTORY  Chief Complaint Back Pain and Vaginal Pain    HPI Deanna Marshall is a 50 y.o. female  50 year old female with history of chronic back pain with narcotic dependendence and concern for drug-seeking behavior, COPD, frequent bouts of nausea and vomiting of unclear etiology who presents for evaluation of greater than 4 weeks of left-sided flank pain which she attributes to her kidney stones. She has missed several appointments for lithotripsy because she is taking care of her ailing mother. She reports that she was previously prescribed OxyContin for pain but she has run out. No fevers or chills, no diarrhea. She has had some pain with urination. No bowel or bladder incontinence, no history of IV drug use, no history of malignancy, no numbness or weakness in the legs. She did not disclose this to me but she was seen at [facility] regional just this morning for the same complaints.  Please see an excerpt from a note below for a physician that recently saw her for back pain: "Extensive hx of chronic low back pain, resulting in chronic opoid dependence. She is followed by behavior health and has been seen here numerous times for mood disorders. She has exhibited behaviors that were concerning for drug seeking behavior. She has frequent complaints of flank pain, but her last evaluation for chronic back pain was in 2014 in the ER. She has an unknown hx of back surgery."   Past Medical History  Diagnosis Date  . Asthma   . Chronic back pain   . Anxiety   . COPD (chronic obstructive pulmonary disease) (HCC)   . Kidney stones     Patient Active Problem List   Diagnosis Date Noted  . Panic anxiety syndrome 03/14/2015  . Depression 03/14/2015  . Opiate dependence, continuous  (HCC) 05/13/2014  . Benzodiazepine dependence, continuous (HCC) 05/13/2014  . LEG PAIN, BILATERAL 07/19/2009  . MUSCLE PAIN 03/13/2009  . VERTIGO 03/13/2009  . NAUSEA 03/13/2009  . HEADACHE 02/15/2009  . LEG CRAMPS 01/20/2009  . ANXIETY 12/09/2008  . ASTHMA 12/09/2008    Past Surgical History  Procedure Laterality Date  . Back surgery    . Abdominal hysterectomy    . Insertion of mesh      Current Outpatient Rx  Name  Route  Sig  Dispense  Refill  . ALBUTEROL IN   Inhalation   Inhale into the lungs.         . ALPRAZolam (XANAX) 1 MG tablet   Oral   Take 1 tablet (1 mg total) by mouth at bedtime as needed for anxiety.   14 tablet   0   . beclomethasone (QVAR) 80 MCG/ACT inhaler   Inhalation   Inhale 2 puffs into the lungs 2 (two) times daily.   1 Inhaler   1   . cephALEXin (KEFLEX) 500 MG capsule   Oral   Take 1 capsule (500 mg total) by mouth 2 (two) times daily.   14 capsule   0   . escitalopram (LEXAPRO) 10 MG tablet      Start with 1/2 tablet for 1 week. Then increase to 1 tablet daily.   30 tablet   3   . ipratropium-albuterol (DUONEB) 0.5-2.5 (3) MG/3ML SOLN   Nebulization   Take 3 mLs by nebulization every 6 (six) hours as  needed (SOB).          . naproxen (NAPROSYN) 500 MG tablet   Oral   Take 1 tablet (500 mg total) by mouth 2 (two) times daily with a meal.   30 tablet   0   . ondansetron (ZOFRAN) 4 MG tablet   Oral   Take 1 tablet (4 mg total) by mouth every 8 (eight) hours as needed for nausea or vomiting.   6 tablet   0   . ondansetron (ZOFRAN-ODT) 8 MG disintegrating tablet   Oral   Take 1 tablet (8 mg total) by mouth every 8 (eight) hours as needed for nausea.   30 tablet   6   . polyethylene glycol powder (GLYCOLAX/MIRALAX) powder   Oral   Take 17 g by mouth daily.   250 g   1     Allergies Compazine and Ketorolac tromethamine  Family History  Problem Relation Age of Onset  . Cancer Mother   . Diabetes Mother   .  Hypertension Mother   . Mental illness Mother   . Cancer Father   . Diabetes Other   . Hypertension Other     Social History Social History  Substance Use Topics  . Smoking status: Current Every Day Smoker -- 0.50 packs/day for 20 years    Types: Cigarettes  . Smokeless tobacco: Never Used  . Alcohol Use: No    Review of Systems Constitutional: No fever/chills Eyes: No visual changes. ENT: No sore throat. Cardiovascular: Denies chest pain. Respiratory: Denies shortness of breath. Gastrointestinal: No abdominal pain.  + nausea, no vomiting.  No diarrhea.  No constipation. Genitourinary: Negative for dysuria. Musculoskeletal: Positive for back pain. Skin: Negative for rash. Neurological: Negative for headaches, focal weakness or numbness.  10-point ROS otherwise negative.  ____________________________________________   PHYSICAL EXAM:  VITAL SIGNS: ED Triage Vitals  Enc Vitals Group     BP 08/13/15 0649 122/58 mmHg     Pulse Rate 08/13/15 0649 66     Resp 08/13/15 0649 18     Temp 08/13/15 0649 97.5 F (36.4 C)     Temp Source 08/13/15 0649 Oral     SpO2 08/13/15 0649 98 %     Weight 08/13/15 0649 150 lb (68.04 kg)     Height 08/13/15 0649 5\' 4"  (1.626 m)     Head Cir --      Peak Flow --      Pain Score 08/13/15 0657 8     Pain Loc --      Pain Edu? --      Excl. in GC? --     Constitutional: Alert and oriented. Well appearing and in no acute distress. Eyes: Conjunctivae are normal. PERRL. EOMI. Head: Atraumatic. Nose: No congestion/rhinnorhea. Mouth/Throat: Mucous membranes are moist.  Oropharynx non-erythematous. Neck: No stridor.  Supple without meningismus. Cardiovascular: Normal rate, regular rhythm. Grossly normal heart sounds.  Good peripheral circulation. Respiratory: Normal respiratory effort.  No retractions. Lungs CTAB. Gastrointestinal: Soft and nontender. No distention. Mild bilateral CVA tenderness. Genitourinary: deferred Musculoskeletal:  No lower extremity tenderness nor edema.  No joint effusions. Neurologic:  Normal speech and language. No gross focal neurologic deficits are appreciated. No gait instability. No saddle anesthesia. 5 out of 5 strength of dorsiflexion of bilateral hallux toes. Skin:  Skin is warm, dry and intact. No rash noted. Psychiatric: Mood and affect are normal. Speech and behavior are normal.  ____________________________________________   LABS (all labs ordered are listed, but  only abnormal results are displayed)  Labs Reviewed - No data to display ____________________________________________  EKG  none ____________________________________________  RADIOLOGY  none ____________________________________________   PROCEDURES  Procedure(s) performed: None  Critical Care performed: No  ____________________________________________   INITIAL IMPRESSION / ASSESSMENT AND PLAN / ED COURSE  Pertinent labs & imaging results that were available during my care of the patient were reviewed by me and considered in my medical decision making (see chart for details).  50 year old female with history of chronic back pain with narcotic dependendence and concern for drug-seeking behavior, COPD, frequent bouts of nausea and vomiting of unclear etiology who presents for evaluation of greater than 4 weeks of left-sided flank pain which she attributes to her kidney stones. She also reports a "prickly sensation" in her vagina when she urinates. On exam, she is sleeping, appearing quite comfortable and awakens to voice and light touch. She does have some mild CVA tenderness  bilaterally but her abdominal exam is benign. She has moist mucous membranes, her vital signs are stable, she is afebrile and she appears to be well-hydrated. She was discharged from [facility] emergency Department at approximately 3 AM this morning for evaluation of this same left flank pain, nausea and vomiting. She had labs which  included a CBC that was notable for a mild leukocytosis with a white count of 12.2. Her BMP was unremarkable. Her urinalysis had leukocytes, bacteria, minimal red blood cells and she was prescribed Bactrim for a possible urinary tract infection. She had a renal ultrasound that was normal and showed no hydronephrosis, and no concern for a large/obstructing kidney stone. She was also discharged with Motrin. She is here for the exact same complaint and is requesting IV narcotic pain medications as well as refill of OxyContin for what I think is just her chronic pain. I discussed with her that her pain currently appears well controlled as she is sleeping and I will not refill her OxyContin from the emergency department. Because of her mild CVA tenderness and her mild leukocytosis at the outside hospital, I will prescribe her Keflex instead of Bactrim and I have also discussed with her that we'll discharge her with a small dose of Zofran to help with her nausea. We discussed return precautions, need for close urology and primary care doctor follow-up and she is comfortable with the discharge plan. ____________________________________________   FINAL CLINICAL IMPRESSION(S) / ED DIAGNOSES  Final diagnoses:  UTI (lower urinary tract infection)  Low back pain without sciatica, unspecified back pain laterality      NEW MEDICATIONS STARTED DURING THIS VISIT:  Discharge Medication List as of 08/13/2015  7:50 AM    START taking these medications   Details  cephALEXin (KEFLEX) 500 MG capsule Take 1 capsule (500 mg total) by mouth 2 (two) times daily., Starting 08/13/2015, Until Discontinued, Print    ondansetron (ZOFRAN) 4 MG tablet Take 1 tablet (4 mg total) by mouth every 8 (eight) hours as needed for nausea or vomiting., Starting 08/13/2015, Until Discontinued, Print         Note:  This document was prepared using Dragon voice recognition software and may include unintentional dictation  errors.    Gayla Doss, MD 08/13/15 [phone]

## 2015-08-14 ENCOUNTER — Encounter (HOSPITAL_COMMUNITY): Payer: Self-pay | Admitting: Physical Medicine and Rehabilitation

## 2015-08-14 ENCOUNTER — Emergency Department (HOSPITAL_COMMUNITY)
Admission: EM | Admit: 2015-08-14 | Discharge: 2015-08-14 | Disposition: A | Discharge status: Home / Self Care | Payer: Medicare Other | Attending: Emergency Medicine | Admitting: Emergency Medicine

## 2015-08-14 ENCOUNTER — Emergency Department (HOSPITAL_COMMUNITY): Payer: Medicare Other

## 2015-08-14 DIAGNOSIS — S3992XA Unspecified injury of lower back, initial encounter: Secondary | ICD-10-CM | POA: Diagnosis not present

## 2015-08-14 DIAGNOSIS — Y9289 Other specified places as the place of occurrence of the external cause: Secondary | ICD-10-CM | POA: Insufficient documentation

## 2015-08-14 DIAGNOSIS — J45909 Unspecified asthma, uncomplicated: Secondary | ICD-10-CM

## 2015-08-14 DIAGNOSIS — J441 Chronic obstructive pulmonary disease with (acute) exacerbation: Secondary | ICD-10-CM | POA: Insufficient documentation

## 2015-08-14 DIAGNOSIS — Z87442 Personal history of urinary calculi: Secondary | ICD-10-CM | POA: Diagnosis not present

## 2015-08-14 DIAGNOSIS — Z87891 Personal history of nicotine dependence: Secondary | ICD-10-CM | POA: Diagnosis not present

## 2015-08-14 DIAGNOSIS — Z79899 Other long term (current) drug therapy: Secondary | ICD-10-CM | POA: Diagnosis not present

## 2015-08-14 DIAGNOSIS — Y998 Other external cause status: Secondary | ICD-10-CM | POA: Insufficient documentation

## 2015-08-14 DIAGNOSIS — Y9389 Activity, other specified: Secondary | ICD-10-CM | POA: Insufficient documentation

## 2015-08-14 DIAGNOSIS — M549 Dorsalgia, unspecified: Secondary | ICD-10-CM

## 2015-08-14 DIAGNOSIS — W1839XA Other fall on same level, initial encounter: Secondary | ICD-10-CM | POA: Diagnosis not present

## 2015-08-14 DIAGNOSIS — Z8659 Personal history of other mental and behavioral disorders: Secondary | ICD-10-CM | POA: Insufficient documentation

## 2015-08-14 DIAGNOSIS — G8929 Other chronic pain: Secondary | ICD-10-CM | POA: Diagnosis not present

## 2015-08-14 DIAGNOSIS — R0602 Shortness of breath: Secondary | ICD-10-CM | POA: Diagnosis present

## 2015-08-14 LAB — CBC WITH DIFFERENTIAL/PLATELET
Basophils Absolute: 0 10*3/uL (ref 0.0–0.1)
Basophils Relative: 1 %
Eosinophils Absolute: 0.2 10*3/uL (ref 0.0–0.7)
Eosinophils Relative: 3 %
HCT: 42.2 % (ref 36.0–46.0)
Hemoglobin: 13.8 g/dL (ref 12.0–15.0)
Lymphocytes Relative: 41 %
Lymphs Abs: 3.2 10*3/uL (ref 0.7–4.0)
MCH: 30.1 pg (ref 26.0–34.0)
MCHC: 32.7 g/dL (ref 30.0–36.0)
MCV: 92.1 fL (ref 78.0–100.0)
Monocytes Absolute: 0.4 10*3/uL (ref 0.1–1.0)
Monocytes Relative: 5 %
Neutro Abs: 4.1 10*3/uL (ref 1.7–7.7)
Neutrophils Relative %: 50 %
Platelets: 265 10*3/uL (ref 150–400)
RBC: 4.58 MIL/uL (ref 3.87–5.11)
RDW: 12.6 % (ref 11.5–15.5)
WBC: 8 10*3/uL (ref 4.0–10.5)

## 2015-08-14 LAB — COMPREHENSIVE METABOLIC PANEL
ALT: 18 U/L (ref 14–54)
AST: 17 U/L (ref 15–41)
Albumin: 3.8 g/dL (ref 3.5–5.0)
Alkaline Phosphatase: 81 U/L (ref 38–126)
Anion gap: 9 (ref 5–15)
BUN: 11 mg/dL (ref 6–20)
CO2: 24 mmol/L (ref 22–32)
Calcium: 9.1 mg/dL (ref 8.9–10.3)
Chloride: 111 mmol/L (ref 101–111)
Creatinine, Ser: 0.69 mg/dL (ref 0.44–1.00)
GFR calc Af Amer: >60 mL/min (ref >60)
GFR calc non Af Amer: >60 mL/min (ref >60)
Glucose, Bld: 93 mg/dL (ref 65–99)
Potassium: 4.1 mmol/L (ref 3.5–5.1)
Sodium: 144 mmol/L (ref 135–145)
Total Bilirubin: 0.7 mg/dL (ref 0.3–1.2)
Total Protein: 6.2 g/dL — ABNORMAL LOW (ref 6.5–8.1)

## 2015-08-14 LAB — I-STAT TROPONIN, ED: Troponin i, poc: 0 ng/mL (ref 0.00–0.08)

## 2015-08-14 LAB — BRAIN NATRIURETIC PEPTIDE: B Natriuretic Peptide: 25.4 pg/mL (ref 0.0–100.0)

## 2015-08-14 MED ORDER — METHOCARBAMOL 500 MG PO TABS: 500.0000 mg | ORAL_TABLET | Freq: Two times a day (BID) | ORAL | Status: DC

## 2015-08-14 MED ORDER — ACETAMINOPHEN 500 MG PO TABS
1000.0000 mg | ORAL_TABLET | Freq: Once | ORAL | Status: AC
  Administered 2015-08-14: 1000 mg via ORAL
  Filled 2015-08-14: qty 2

## 2015-08-14 MED ORDER — LIDOCAINE 5 % EX PTCH: 1.0000 | MEDICATED_PATCH | CUTANEOUS | Status: DC

## 2015-08-14 MED ORDER — NAPROXEN 500 MG PO TABS: 500.0000 mg | ORAL_TABLET | Freq: Two times a day (BID) | ORAL | Status: DC

## 2015-08-14 MED ORDER — IPRATROPIUM-ALBUTEROL 0.5-2.5 (3) MG/3ML IN SOLN
3.0000 mL | Freq: Once | RESPIRATORY_TRACT | Status: AC
  Administered 2015-08-14: 3 mL via RESPIRATORY_TRACT
  Filled 2015-08-14: qty 3

## 2015-08-14 MED ORDER — SODIUM CHLORIDE 0.9 % IV BOLUS (SEPSIS)
1000.0000 mL | Freq: Once | INTRAVENOUS | Status: AC
  Administered 2015-08-14: 1000 mL via INTRAVENOUS

## 2015-08-14 MED ORDER — AZITHROMYCIN 250 MG PO TABS: 250.0000 mg | ORAL_TABLET | Freq: Every day | ORAL | Status: DC

## 2015-08-14 MED ORDER — LIDOCAINE 5 % EX PTCH
2.0000 | MEDICATED_PATCH | CUTANEOUS | Status: DC
  Administered 2015-08-14: 2 via TRANSDERMAL
  Filled 2015-08-14: qty 2

## 2015-08-14 MED ORDER — METHYLPREDNISOLONE SODIUM SUCC 125 MG IJ SOLR
125.0000 mg | Freq: Once | INTRAMUSCULAR | Status: AC
  Administered 2015-08-14: 125 mg via INTRAVENOUS
  Filled 2015-08-14: qty 2

## 2015-08-14 MED ORDER — ALBUTEROL SULFATE HFA 108 (90 BASE) MCG/ACT IN AERS: 2.0000 | INHALATION_SPRAY | RESPIRATORY_TRACT | Status: AC | PRN

## 2015-08-14 MED ORDER — OXYCODONE-ACETAMINOPHEN 5-325 MG PO TABS
1.0000 | ORAL_TABLET | Freq: Once | ORAL | Status: AC
  Administered 2015-08-14: 1 via ORAL
  Filled 2015-08-14: qty 1

## 2015-08-14 NOTE — ED Notes (Addendum)
Tick removed from back of patient's head.

## 2015-08-14 NOTE — ED Notes (Signed)
Pt reports issues with asthma, no relief with inhalers at home. Audible wheezing noted upon arrival to ED. Also states chronic back pain, history of back surgery x1 year ago. Was seen at [facility] for same.

## 2015-08-14 NOTE — ED Provider Notes (Signed)
CSN: [id number]     Arrival date & time 08/14/15  7829 History   First MD Initiated Contact with Patient 08/14/15 [phone]     Chief Complaint  Patient presents with  . Asthma  . Back Pain     (Consider location/radiation/quality/duration/timing/severity/associated sxs/prior Treatment) HPI   Deanna Marshall is a 50 y.o. female, with a history of Asthma, chronic pain, and COPD, presenting to the ED with two complaints: 1. Shortness of breath that has been worsening over the last two months, worse at night. Pt also endorses 15 lb weight loss over the last two months and endorses two episodes of emesis in last 24 hours. Has been using her albuterol inhaler every 4 hours. Pt states she is supposed to be on supplemental oxygen, but does not use it. States she moved and hasn't taken the time to get it set up at her new location. Pt endorses recent pneumonia in February.  2. Pt also complains of chronic lower back pain worsening since a fall three days ago. Pt states she was trying to help her elderly mother off the floor when she lost her balance and fell. Pt denies LOC or head trauma.  Pt denies fever, diarrhea, urinary complaints, chest pain, neuro deficits, or any other complaints.    Past Medical History  Diagnosis Date  . Asthma   . Chronic back pain   . Anxiety   . COPD (chronic obstructive pulmonary disease) (HCC)   . Kidney stones    Past Surgical History  Procedure Laterality Date  . Back surgery    . Abdominal hysterectomy    . Insertion of mesh     Family History  Problem Relation Age of Onset  . Cancer Mother   . Diabetes Mother   . Hypertension Mother   . Mental illness Mother   . Cancer Father   . Diabetes Other   . Hypertension Other    Social History  Substance Use Topics  . Smoking status: Former Smoker -- 0.50 packs/day for 20 years    Types: Cigarettes  . Smokeless tobacco: Never Used  . Alcohol Use: No   OB History    No data available     Review of Systems   Constitutional: Positive for unexpected weight change. Negative for fever, chills and diaphoresis.  Respiratory: Positive for cough, shortness of breath and wheezing.   Musculoskeletal: Positive for back pain. Negative for neck pain.  Skin: Negative for color change and pallor.  Neurological: Negative for syncope, weakness and numbness.      Allergies  Compazine and Ketorolac tromethamine  Home Medications   Prior to Admission medications   Medication Sig Start Date End Date Taking? Authorizing Provider  acetaminophen (TYLENOL) 325 MG tablet Take 650 mg by mouth every 6 (six) hours as needed.   Yes Historical Provider, MD  ALBUTEROL IN Inhale into the lungs.   Yes Historical Provider, MD  ALPRAZolam Prudy Feeler) 1 MG tablet Take 1 tablet (1 mg total) by mouth at bedtime as needed for anxiety. 08/06/15  Yes Wallis Bamberg, PA-C  promethazine (PHENERGAN) 12.5 MG suppository Place 12.5 mg rectally every 6 (six) hours as needed for nausea or vomiting.   Yes Historical Provider, MD  zolpidem (AMBIEN) 10 MG tablet Take 10 mg by mouth at bedtime as needed for sleep.   Yes Historical Provider, MD  albuterol (PROVENTIL HFA;VENTOLIN HFA) 108 (90 Base) MCG/ACT inhaler Inhale 2 puffs into the lungs every 4 (four) hours as needed for wheezing or  shortness of breath. 08/14/15   Aitan Rossbach C Marylynne Keelin, PA-C  azithromycin (ZITHROMAX) 250 MG tablet Take 1 tablet (250 mg total) by mouth daily. Take first 2 tablets together, then 1 every day until finished. 08/14/15   Dovey Fatzinger C Lechelle Wrigley, PA-C  lidocaine (LIDODERM) 5 % Place 1 patch onto the skin daily. Remove & Discard patch within 12 hours or as directed by MD 08/14/15   Anselm PancoastShawn C Damiah Mcdonald, PA-C  methocarbamol (ROBAXIN) 500 MG tablet Take 1 tablet (500 mg total) by mouth 2 (two) times daily. 08/14/15   Myra Weng C Krystall Kruckenberg, PA-C  naproxen (NAPROSYN) 500 MG tablet Take 1 tablet (500 mg total) by mouth 2 (two) times daily. 08/14/15   Majestic Molony C Myonna Chisom, PA-C   BP 103/64 mmHg  Pulse 60  Temp(Src) 98.3 F (36.8 C)  (Oral)  Resp 18  Ht 5\' 4"  (1.626 m)  Wt 68.04 kg  BMI 25.73 kg/m2  SpO2 100% Physical Exam  Constitutional: She is oriented to person, place, and time. She appears well-developed and well-nourished. No distress.  HENT:  Head: Normocephalic and atraumatic.  Eyes: Conjunctivae and EOM are normal. Pupils are equal, round, and reactive to light.  Neck: Normal range of motion. Neck supple.  Cardiovascular: Normal rate, regular rhythm, normal heart sounds and intact distal pulses.   Pulmonary/Chest: Effort normal. She has wheezes in the right upper field, the right middle field, the right lower field, the left upper field, the left middle field and the left lower field.  Increased work of breathing. Speaks in phrases, pausing to catch breath inbetween.  Abdominal: Soft. There is no tenderness. There is no guarding.  Musculoskeletal: She exhibits no edema or tenderness.  Full ROM in all extremities and spine. Some midline lumbar tenderness, as well as tenderness to bilateral lumbar musculature.  Lymphadenopathy:    She has no cervical adenopathy.  Neurological: She is alert and oriented to person, place, and time. She has normal reflexes.  No sensory deficits. Strength 5/5 in all extremities. No gait disturbance. Coordination intact. Cranial nerves III-XII grossly intact. No facial droop.   Skin: Skin is warm and dry. She is not diaphoretic.  Psychiatric: She has a normal mood and affect. Her behavior is normal.  Nursing note and vitals reviewed.   ED Course  Procedures (including critical care time) Labs Review Labs Reviewed  COMPREHENSIVE METABOLIC PANEL - Abnormal; Notable for the following:    Total Protein 6.2 (*)    All other components within normal limits  CBC WITH DIFFERENTIAL/PLATELET  BRAIN NATRIURETIC PEPTIDE  I-STAT TROPOININ, ED    Imaging Review Dg Chest 2 View  08/14/2015  CLINICAL DATA:  Cough and pain EXAM: CHEST  2 VIEW COMPARISON:  August 02, 2015 FINDINGS: There  is no edema or consolidation. The heart size and pulmonary vascularity are normal. No adenopathy. No pneumothorax. No bone lesions. IMPRESSION: No edema or consolidation. Electronically Signed   By: Bretta BangWilliam  Woodruff III M.D.   On: 08/14/2015 10:00   Dg Thoracic Spine 2 View  08/14/2015  CLINICAL DATA:  Thoracic spine lifting injury 3 days ago. Pain. Initial encounter. EXAM: THORACIC SPINE 2 VIEWS COMPARISON:  PA and lateral chest 08/02/2015. FINDINGS: There is no evidence of thoracic spine fracture. Alignment is normal. No other significant bone abnormalities are identified. IMPRESSION: Negative exam. Electronically Signed   By: Drusilla Kannerhomas  Dalessio M.D.   On: 08/14/2015 10:12   Dg Lumbar Spine Complete  08/14/2015  CLINICAL DATA:  Pain following lifting injury 3 days prior  EXAM: LUMBAR SPINE - COMPLETE 4+ VIEW COMPARISON:  None. FINDINGS: Frontal, lateral, spot lumbosacral lateral, and bilateral oblique views were obtained. There are 5 non-rib-bearing lumbar type vertebral bodies. There is no fracture or spondylolisthesis. There is mild disc space narrowing at L5-S1. Other disc spaces appear normal. There is no appreciable facet arthropathy. IMPRESSION: Disc space narrowing at L5-S1. Other disc spaces appear normal. No fracture or spondylolisthesis. Electronically Signed   By: Bretta Bang III M.D.   On: 08/14/2015 10:07   I have personally reviewed and evaluated these images and lab results as part of my medical decision-making.   EKG Interpretation   Date/Time:  Monday Aug 14 2015 08:47:21 EDT Ventricular Rate:  81 PR Interval:  141 QRS Duration: 82 QT Interval:  352 QTC Calculation: 408 R Axis:   63 Text Interpretation:  Sinus rhythm No significant change was found  Confirmed by CAMPOS  MD, KEVIN ([id number]) on 08/15/2015 9:52:58 AM       Medications  ipratropium-albuterol (DUONEB) 0.5-2.5 (3) MG/3ML nebulizer solution 3 mL (3 mLs Nebulization Given 08/14/15 0949)  methylPREDNISolone sodium  succinate (SOLU-MEDROL) 125 mg/2 mL injection 125 mg (125 mg Intravenous Given 08/14/15 0948)  acetaminophen (TYLENOL) tablet 1,000 mg (1,000 mg Oral Given 08/14/15 0935)  sodium chloride 0.9 % bolus 1,000 mL (0 mLs Intravenous Stopped 08/14/15 1039)  oxyCODONE-acetaminophen (PERCOCET/ROXICET) 5-325 MG per tablet 1 tablet (1 tablet Oral Given 08/14/15 1127)    MDM   Final diagnoses:  Chronic back pain  Asthma, unspecified asthma severity, uncomplicated    Deanna Marshall presents with complaints of shortness of breath and acute on chronic lower back pain.  This patient's presentation of shortness of breath is consistent with a viral illness exacerbating her asthma. This was addressed and the patient improved. As for the back pain, I suspect this is actually chronic pain. Patient was seen yesterday at [facility] for back and flank pain. Each time she comes in for evaluation of back pain, she gives an acute reason why the pain is different. The patient's pain was being treated with nonnarcotic options. Patient became somewhat verbally abusive to the RN, Nehemiah Settle, and stated, "I just need something for my pain. I need something narcotic." During her time in the ED, patient states she called her pain management clinic who told her that they would see her right after her ED visit today. Patient then wanted discharge as soon as possible. Return precautions discussed. Orthopedic referral given. Patient voiced understanding of these instructions and was comfortable with discharge.  Background: Chart review reveals that the patient seems to have a long history of chronic back and flank pain, opioid dependence, and concern for drug-seeking behavior. Patient is frequently prescribed Suboxone. Patient has also been recently prescribed 2 prescriptions for hydrocodone homatropine syrup. She is also noted to be in both the [place] and [place] databases with frequent alprazolam and lorazepam prescriptions. Pt  states her back surgeries were performed at [facility] by Dr. Zachery Dauer. She states she had a ruptured disc repair and L4-L5 fusion. There is no record of these surgeries or her seeing a Dr. Zachery Dauer at [facility], but perhaps she is mistaken.    Filed Vitals:   08/14/15 0840 08/14/15 0845 08/14/15 1045 08/14/15 1130  BP: 112/83 105/74 111/70 103/64  Pulse: 72  63 60  Temp: 97.7 F (36.5 C)   98.3 F (36.8 C)  TempSrc: Oral   Oral  Resp: 18 15 17 18   Height: 5\' 4"  (1.626 m)  Weight: 68.04 kg     SpO2: 99% 99% 100% 100%     Anselm Pancoast, PA-C 08/15/15 1657  Alvira Monday, MD 08/18/15 1104

## 2015-08-14 NOTE — Discharge Instructions (Signed)
You have been seen today for asthma and acute on chronic back pain. Your imaging and lab tests showed no acute abnormalities. Continue to take home pain medications as prescribed. Follow-up with orthopedics to address your back pain. Call the number provided to set up an appointment. Follow up with PCP as soon as possible for reevaluation and chronic management. Return to ED should symptoms worsen. It is thought that your shortness of breath is probably due to a viral illness exacerbating your asthma. Viral infections do not require antibiotics. However, due to your other risk factors, an antibiotic is being prescribed. Please take all of your antibiotics until finished!   You may develop abdominal discomfort or diarrhea from the antibiotic.  You may help offset this with probiotics which you can buy or get in yogurt. Do not eat or take the probiotics until 2 hours after your antibiotic.

## 2015-08-17 DIAGNOSIS — Z91148 Patient's other noncompliance with medication regimen for other reason: Secondary | ICD-10-CM | POA: Insufficient documentation

## 2015-08-17 DIAGNOSIS — Z9114 Patient's other noncompliance with medication regimen: Secondary | ICD-10-CM | POA: Insufficient documentation

## 2015-09-19 ENCOUNTER — Ambulatory Visit (INDEPENDENT_AMBULATORY_CARE_PROVIDER_SITE_OTHER): Payer: Medicare Other | Admitting: Urgent Care

## 2015-09-19 VITALS — BP 116/62 | HR 101 | Temp 98.1°F | Resp 18 | Ht 64.0 in | Wt 132.0 lb

## 2015-09-19 DIAGNOSIS — F411 Generalized anxiety disorder: Secondary | ICD-10-CM

## 2015-09-19 DIAGNOSIS — F329 Major depressive disorder, single episode, unspecified: Secondary | ICD-10-CM | POA: Diagnosis not present

## 2015-09-19 DIAGNOSIS — F32A Depression, unspecified: Secondary | ICD-10-CM

## 2015-09-19 MED ORDER — ALPRAZOLAM 1 MG PO TABS: 1.0000 mg | ORAL_TABLET | Freq: Every evening | ORAL | Status: DC | PRN

## 2015-09-19 NOTE — Progress Notes (Signed)
    MRN: [id number] DOB: 08/26/1965  Subjective:   Deanna Marshall is a 50 y.o. female presenting for chief complaint of Medication Refill  Reports that she wants medication refills for her Xanax and Ambien, states that she is not asking for pain medication and just wants medications to help her sleep. She has been getting follow up with Dr. Marva PandaSerenity with [facility]. Their last visit was last month. Patient is under significant emotional and financial stress. She is on disability, does not have a job. She states that she may lose her apartment, she's been there for 6 months and got behind on payments. Her biggest concern is weight loss, which she claims has been due to inability to purchase food. States that her family has come in to help her from [place]. She has significant difficulty with sleep. She is not interested in seeing her last psychiatrist. Denies SI, HI.  Deanna Marshall has a current medication list which includes the following prescription(s): acetaminophen, albuterol, albuterol, alprazolam, and zolpidem. Also is allergic to compazine and ketorolac tromethamine.  Deanna Marshall  has a past medical history of Asthma; Chronic back pain; Anxiety; COPD (chronic obstructive pulmonary disease) (HCC); and Kidney stones. Also  has past surgical history that includes Back surgery; Abdominal hysterectomy; and Insertion of mesh.  Objective:   Vitals: BP 116/62 mmHg  Pulse 101  Temp(Src) 98.1 F (36.7 C) (Oral)  Resp 18  Ht 5\' 4"  (1.626 m)  Wt 132 lb (59.875 kg)  BMI 22.65 kg/m2  SpO2 96%  Wt Readings from Last 3 Encounters:  09/19/15 132 lb (59.875 kg)  08/14/15 150 lb (68.04 kg)  08/13/15 150 lb (68.04 kg)   Physical Exam  Constitutional: She is oriented to person, place, and time. She appears well-developed and well-nourished.  Cardiovascular: Normal rate.   Pulmonary/Chest: Effort normal.  Neurological: She is alert and oriented to person, place, and time.  Psychiatric: Her mood appears  anxious. Her affect is labile. Her affect is not angry and not inappropriate. Her speech is not rapid and/or pressured and not slurred. She is not agitated, not aggressive and not slowed. She exhibits a depressed mood. She expresses no homicidal and no suicidal ideation.   Assessment and Plan :   1. Depression 2. Anxiety state - I counseled patient on management of her mental health and recommended she report to the ED for an emergent evaluation which she refused. She does not have a psychiatrist right now and agreed to an urgent referral to a psychiatrist. However, she also insisted getting refills for 2 weeks of Ambien and Xanax and starting Lunesta. I reminded patient that use of 2 benzodiazepines is inappropriate especially without use of SSRI. I also reminded her that I agreed to fill her prescriptions at her last visit as a courtesy refill so that she could make it to her visit with her psychiatrist. Since she refused work up today and my recommendations, I advised patient that I would provide her with 1 dose of Xanax and place an urgent referral to psychiatry. I also let patient know that I would no longer prescribe controlled substances to her. She verbalized understanding.  Deanna BambergMario Myrikal Messmer, PA-C [facility] [phone] 09/19/2015 4:21 PM

## 2015-09-19 NOTE — Patient Instructions (Addendum)
I will provide one 1mg  dose of Xanax to help you sleep tonight. This is the last controlled substance prescription that I am providing to you. My recommendation for you is to go the hospital tonight for ar urgent psychiatric evaluation. Since you are not willing to do this, I will place an urgent referral for you to be scheduled to been seen for the first available appointment by a psychiatrist. If you have any thoughts about suicide or death which you have denied today, you need to call 911 or report to the Emergency Department immediately.    IF you received an x-ray today, you will receive an invoice from [facility]. Please contact [facility] at [phone] with questions or concerns regarding your invoice.   IF you received labwork today, you will receive an invoice from [facility]. Please contact [facility] at [phone] with questions or concerns regarding your invoice.   Our billing staff will not be able to assist you with questions regarding bills from these companies.  You will be contacted with the lab results as soon as they are available. The fastest way to get your results is to activate your My Chart account. Instructions are located on the last page of this paperwork. If you have not heard from us regarding the results in 2 weeks, please contact this office.

## 2015-10-02 ENCOUNTER — Emergency Department (HOSPITAL_COMMUNITY)
Admission: EM | Admit: 2015-10-02 | Discharge: 2015-10-02 | Disposition: A | Discharge status: Home / Self Care | Payer: Medicare Other | Source: Home / Self Care

## 2015-10-02 ENCOUNTER — Emergency Department (HOSPITAL_COMMUNITY): Payer: Medicare Other

## 2015-10-02 ENCOUNTER — Encounter (HOSPITAL_COMMUNITY): Payer: Self-pay | Admitting: Emergency Medicine

## 2015-10-02 ENCOUNTER — Other Ambulatory Visit: Payer: Self-pay

## 2015-10-02 ENCOUNTER — Encounter (HOSPITAL_COMMUNITY): Payer: Self-pay

## 2015-10-02 ENCOUNTER — Ambulatory Visit (HOSPITAL_COMMUNITY)
Admission: EM | Admit: 2015-10-02 | Discharge: 2015-10-02 | Disposition: A | Discharge status: Home / Self Care | Payer: Medicare Other | Attending: Family Medicine | Admitting: Family Medicine

## 2015-10-02 DIAGNOSIS — F17210 Nicotine dependence, cigarettes, uncomplicated: Secondary | ICD-10-CM

## 2015-10-02 DIAGNOSIS — F411 Generalized anxiety disorder: Secondary | ICD-10-CM | POA: Diagnosis not present

## 2015-10-02 DIAGNOSIS — Z5321 Procedure and treatment not carried out due to patient leaving prior to being seen by health care provider: Secondary | ICD-10-CM

## 2015-10-02 DIAGNOSIS — J449 Chronic obstructive pulmonary disease, unspecified: Secondary | ICD-10-CM | POA: Insufficient documentation

## 2015-10-02 DIAGNOSIS — F419 Anxiety disorder, unspecified: Secondary | ICD-10-CM | POA: Diagnosis present

## 2015-10-02 DIAGNOSIS — F410 Panic disorder [episodic paroxysmal anxiety] without agoraphobia: Secondary | ICD-10-CM | POA: Insufficient documentation

## 2015-10-02 MED ORDER — CLONAZEPAM 0.5 MG PO TABS: 0.5000 mg | ORAL_TABLET | Freq: Two times a day (BID) | ORAL | Status: DC | PRN

## 2015-10-02 NOTE — ED Provider Notes (Signed)
CSN: [id number]     Arrival date & time 10/02/15  1523 History   First MD Initiated Contact with Patient 10/02/15 1629     Chief Complaint  Patient presents with  . Anxiety   (Consider location/radiation/quality/duration/timing/severity/associated sxs/prior Treatment) Patient is a 50 y.o. female presenting with anxiety. The history is provided by the patient.  Anxiety This is a chronic problem. The current episode started more than 1 week ago. The problem has been gradually worsening (reports personal issues at home. no suicide thoughts.). Pertinent negatives include no chest pain, no abdominal pain, no headaches and no shortness of breath. The symptoms are aggravated by stress.    Past Medical History  Diagnosis Date  . Asthma   . Chronic back pain   . Anxiety   . COPD (chronic obstructive pulmonary disease) (HCC)   . Kidney stones    Past Surgical History  Procedure Laterality Date  . Back surgery    . Abdominal hysterectomy    . Insertion of mesh     Family History  Problem Relation Age of Onset  . Cancer Mother   . Diabetes Mother   . Hypertension Mother   . Mental illness Mother   . Cancer Father   . Diabetes Other   . Hypertension Other    Social History  Substance Use Topics  . Smoking status: Current Every Day Smoker -- 0.50 packs/day for 20 years    Types: Cigarettes  . Smokeless tobacco: Never Used  . Alcohol Use: No   OB History    No data available     Review of Systems  Respiratory: Negative.  Negative for shortness of breath.   Cardiovascular: Negative.  Negative for chest pain.  Gastrointestinal: Negative for abdominal pain.  Neurological: Negative for headaches.  Psychiatric/Behavioral: Positive for sleep disturbance. Negative for suicidal ideas and self-injury. The patient is nervous/anxious.   All other systems reviewed and are negative.   Allergies  Compazine and Ketorolac tromethamine  Home Medications   Prior to Admission medications    Medication Sig Start Date End Date Taking? Authorizing Provider  albuterol (PROVENTIL HFA;VENTOLIN HFA) 108 (90 Base) MCG/ACT inhaler Inhale 2 puffs into the lungs every 4 (four) hours as needed for wheezing or shortness of breath. 08/14/15  Yes Shawn C Joy, PA-C  ALPRAZolam (XANAX) 1 MG tablet Take 1 tablet (1 mg total) by mouth at bedtime as needed for anxiety. 09/19/15  Yes Wallis BambergMario Mani, PA-C  zolpidem (AMBIEN) 10 MG tablet Take 10 mg by mouth at bedtime as needed for sleep.   Yes Historical Provider, MD  acetaminophen (TYLENOL) 325 MG tablet Take 650 mg by mouth every 6 (six) hours as needed.    Historical Provider, MD  ALBUTEROL IN Inhale into the lungs.    Historical Provider, MD  clonazePAM (KLONOPIN) 0.5 MG tablet Take 1 tablet (0.5 mg total) by mouth 2 (two) times daily as needed for anxiety. 10/02/15   Linna HoffJames D Joren Rehm, MD   Meds Ordered and Administered this Visit  Medications - No data to display  BP 103/74 mmHg  Pulse 91  Temp(Src) 99.7 F (37.6 C) (Oral)  Resp 18  SpO2 99% No data found.   Physical Exam  Constitutional: She is oriented to person, place, and time. She appears well-developed and well-nourished.  Eyes: Conjunctivae are normal. Pupils are equal, round, and reactive to light.  Neck: Normal range of motion. Neck supple.  Cardiovascular: Normal rate, normal heart sounds and intact distal pulses.  Pulmonary/Chest: Effort normal and breath sounds normal.  Lymphadenopathy:    She has no cervical adenopathy.  Neurological: She is alert and oriented to person, place, and time.  Skin: Skin is warm and dry.  Nursing note and vitals reviewed.   ED Course  Procedures (including critical care time)  Labs Review Labs Reviewed - No data to display  Imaging Review Dg Chest 2 View  10/02/2015  CLINICAL DATA:  Shortness of breath.  Anxiety. EXAM: CHEST  2 VIEW COMPARISON:  Aug 14, 2015 FINDINGS: Lungs are clear. Heart size and pulmonary vascularity are normal. No  adenopathy. No bone lesions. There are surgical clips in the upper abdomen. IMPRESSION: No edema or consolidation. Electronically Signed   By: Bretta BangWilliam  Woodruff III M.D.   On: 10/02/2015 13:30     Visual Acuity Review  Right Eye Distance:   Left Eye Distance:   Bilateral Distance:    Right Eye Near:   Left Eye Near:    Bilateral Near:         MDM   1. Generalized anxiety disorder        Linna HoffJames D Manolo Bosket, MD 10/02/15 [phone]

## 2015-10-02 NOTE — ED Notes (Signed)
Per Pt, Pt is coming from home. Reports "feeling like I am having a panic attack." Reports feeling SOB and like she can't swallow. Pt is calming siting talking with the nurse. Normal respirations. No acute distress at this time. Reports being out of her medication.

## 2015-10-02 NOTE — ED Notes (Signed)
The [facility] with a complaint of anxiety that has been occurring for some time. The patient stated that she has not been able to sleep and has lost 20 pounds in the last month.

## 2015-10-02 NOTE — ED Notes (Signed)
No answer x1

## 2015-10-03 ENCOUNTER — Encounter (HOSPITAL_COMMUNITY): Payer: Self-pay | Admitting: Emergency Medicine

## 2015-10-03 ENCOUNTER — Ambulatory Visit (HOSPITAL_COMMUNITY)
Admission: EM | Admit: 2015-10-03 | Discharge: 2015-10-03 | Disposition: A | Discharge status: Home / Self Care | Payer: Medicare Other | Attending: Family Medicine | Admitting: Family Medicine

## 2015-10-03 ENCOUNTER — Emergency Department (HOSPITAL_COMMUNITY)
Admission: EM | Admit: 2015-10-03 | Discharge: 2015-10-03 | Disposition: A | Discharge status: Home / Self Care | Payer: Medicare Other | Attending: Emergency Medicine | Admitting: Emergency Medicine

## 2015-10-03 ENCOUNTER — Encounter: Payer: Self-pay | Admitting: *Deleted

## 2015-10-03 ENCOUNTER — Emergency Department (INDEPENDENT_AMBULATORY_CARE_PROVIDER_SITE_OTHER)
Admission: EM | Admit: 2015-10-03 | Discharge: 2015-10-03 | Disposition: A | Discharge status: Home / Self Care | Payer: Medicare Other | Source: Home / Self Care | Attending: Family Medicine | Admitting: Family Medicine

## 2015-10-03 ENCOUNTER — Encounter (HOSPITAL_COMMUNITY): Payer: Self-pay

## 2015-10-03 DIAGNOSIS — G4700 Insomnia, unspecified: Secondary | ICD-10-CM | POA: Diagnosis not present

## 2015-10-03 DIAGNOSIS — F418 Other specified anxiety disorders: Secondary | ICD-10-CM | POA: Diagnosis not present

## 2015-10-03 DIAGNOSIS — F329 Major depressive disorder, single episode, unspecified: Secondary | ICD-10-CM

## 2015-10-03 DIAGNOSIS — F32A Depression, unspecified: Secondary | ICD-10-CM

## 2015-10-03 DIAGNOSIS — F419 Anxiety disorder, unspecified: Secondary | ICD-10-CM

## 2015-10-03 DIAGNOSIS — F17210 Nicotine dependence, cigarettes, uncomplicated: Secondary | ICD-10-CM | POA: Insufficient documentation

## 2015-10-03 DIAGNOSIS — F411 Generalized anxiety disorder: Secondary | ICD-10-CM | POA: Diagnosis not present

## 2015-10-03 DIAGNOSIS — J449 Chronic obstructive pulmonary disease, unspecified: Secondary | ICD-10-CM | POA: Insufficient documentation

## 2015-10-03 MED ORDER — HYDROXYZINE HCL 25 MG PO TABS: 25.0000 mg | ORAL_TABLET | Freq: Three times a day (TID) | ORAL | Status: DC | PRN

## 2015-10-03 MED ORDER — HYDROXYZINE HCL 10 MG PO TABS
10.0000 mg | ORAL_TABLET | Freq: Once | ORAL | Status: AC
  Administered 2015-10-03: 10 mg via ORAL
  Filled 2015-10-03: qty 1

## 2015-10-03 NOTE — ED Provider Notes (Signed)
CSN: [id number]     Arrival date & time 10/03/15  1256 History   First MD Initiated Contact with Patient 10/03/15 1313     Chief Complaint  Patient presents with  . Panic Attack   (Consider location/radiation/quality/duration/timing/severity/associated sxs/prior Treatment) Patient is a 50 y.o. female presenting with mental health disorder. The history is provided by the patient.  Mental Health Problem Presenting symptoms: agitation and depression   Progression since onset: [facility], and again today in ER, wants different meds. Chronicity:  Recurrent Context: medication and stressful life event   Associated symptoms: anxiety     Past Medical History  Diagnosis Date  . Asthma   . Chronic back pain   . Anxiety   . COPD (chronic obstructive pulmonary disease) (HCC)   . Kidney stones    Past Surgical History  Procedure Laterality Date  . Back surgery    . Abdominal hysterectomy    . Insertion of mesh     Family History  Problem Relation Age of Onset  . Cancer Mother   . Diabetes Mother   . Hypertension Mother   . Mental illness Mother   . Cancer Father   . Diabetes Other   . Hypertension Other    Social History  Substance Use Topics  . Smoking status: Current Every Day Smoker -- 0.50 packs/day for 20 years    Types: Cigarettes  . Smokeless tobacco: Never Used  . Alcohol Use: No   OB History    No data available     Review of Systems  Psychiatric/Behavioral: Positive for sleep disturbance and agitation. The patient is nervous/anxious.   All other systems reviewed and are negative.   Allergies  Compazine and Ketorolac tromethamine  Home Medications   Prior to Admission medications   Medication Sig Start Date End Date Taking? Authorizing Provider  clonazePAM (KLONOPIN) 0.5 MG tablet Take 1 tablet (0.5 mg total) by mouth 2 (two) times daily as needed for anxiety. 10/02/15  Yes Linna HoffJames D Cadyn Rodger, MD  acetaminophen (TYLENOL) 325 MG tablet Take 650 mg by  mouth every 6 (six) hours as needed.    Historical Provider, MD  albuterol (PROVENTIL HFA;VENTOLIN HFA) 108 (90 Base) MCG/ACT inhaler Inhale 2 puffs into the lungs every 4 (four) hours as needed for wheezing or shortness of breath. 08/14/15   Shawn C Joy, PA-C  ALBUTEROL IN Inhale into the lungs.    Historical Provider, MD  ALPRAZolam Prudy Feeler(XANAX) 1 MG tablet Take 1 tablet (1 mg total) by mouth at bedtime as needed for anxiety. 09/19/15   Wallis BambergMario Mani, PA-C  hydrOXYzine (ATARAX/VISTARIL) 25 MG tablet Take 1 tablet (25 mg total) by mouth every 8 (eight) hours as needed for anxiety. 10/03/15   Garlon HatchetLisa M Sanders, PA-C  zolpidem (AMBIEN) 10 MG tablet Take 10 mg by mouth at bedtime as needed for sleep.    Historical Provider, MD   Meds Ordered and Administered this Visit  Medications - No data to display  BP 129/85 mmHg  Pulse 105  Temp(Src) 99 F (37.2 C) (Oral)  SpO2 95% No data found.   Physical Exam  Constitutional: She is oriented to person, place, and time. She appears well-developed and well-nourished.  Neurological: She is alert and oriented to person, place, and time.  Skin: Skin is warm and dry.  Nursing note and vitals reviewed.   ED Course  Procedures (including critical care time)  Labs Review Labs Reviewed - No data to display  Imaging Review  Dg Chest 2 View  10/02/2015  CLINICAL DATA:  Shortness of breath.  Anxiety. EXAM: CHEST  2 VIEW COMPARISON:  Aug 14, 2015 FINDINGS: Lungs are clear. Heart size and pulmonary vascularity are normal. No adenopathy. No bone lesions. There are surgical clips in the upper abdomen. IMPRESSION: No edema or consolidation. Electronically Signed   By: Bretta BangWilliam  Woodruff III M.D.   On: 10/02/2015 13:30     Visual Acuity Review  Right Eye Distance:   Left Eye Distance:   Bilateral Distance:    Right Eye Near:   Left Eye Near:    Bilateral Near:         MDM   1. Generalized anxiety disorder        Linna HoffJames D Stafford [place], MD 10/03/15 [phone]

## 2015-10-03 NOTE — Discharge Instructions (Signed)
Go to  Behavioral health for further help with problem/

## 2015-10-03 NOTE — ED Notes (Signed)
Pt returning today after two doses of Klonopin for her anxiety.  Pt states it is not helping and she cannot sleep or eat.  Pt's VS are WNL but she is shaky and tearful.

## 2015-10-03 NOTE — Discharge Instructions (Signed)
Emergency Department Resource Guide °1) Find a Doctor and Pay Out of Pocket °Although you won't have to find out who is covered by your insurance plan, it is a good idea to ask around and get recommendations. You will then need to call the office and see if the doctor you have chosen will accept you as a new patient and what types of options they offer for patients who are self-pay. Some doctors offer discounts or will set up payment plans for their patients who do not have insurance, but you will need to ask so you aren't surprised when you get to your appointment. ° °2) Contact Your Local Health Department °Not all health departments have doctors that can see patients for sick visits, but many do, so it is worth a call to see if yours does. If you don't know where your local health department is, you can check in your phone book. The CDC also has a tool to help you locate your state's health department, and many state websites also have listings of all of their local health departments. ° °3) Find a Walk-in Clinic °If your illness is not likely to be very severe or complicated, you may want to try a walk in clinic. These are popping up all over the country in pharmacies, drugstores, and shopping centers. They're usually staffed by nurse practitioners or physician assistants that have been trained to treat common illnesses and complaints. They're usually fairly quick and inexpensive. However, if you have serious medical issues or chronic medical problems, these are probably not your best option. ° °No Primary Care Doctor: °- Call [facility] at  832-8000 - they can help you locate a primary care doctor that  accepts your insurance, provides certain services, etc. °- Physician Referral Service- 1-800-533-3463 ° °Chronic Pain Problems: °Organization         Address  Phone   Notes  °[facility]  ([phone]) [phone] Patients need to be referred by their primary care doctor.  ° °Medication  Assistance: °Organization         Address  Phone   Notes  °[place] [facility] [address] °[place], [place] [address] (336) 641-8030 --Must be a resident of [place] °-- Must have NO insurance coverage whatsoever (no Medicaid/ Medicare, etc.) °-- The pt. MUST have a primary care doctor that directs their care regularly and follows them in the community °  °[facility]  (866) 331-1348   °[employer] Way  (888) 892-1162   ° °Agencies that provide inexpensive medical care: °Organization         Address                                                       Phone                                                                            Notes  °[facility]  (336) 832-8035   °[facility]    (336)   832-7272   °[facility] [address] °[place], [place] [address] (336) 832-4777   °[facility] [address], °[place] (336) 271-4999   °[facility]    (336) 373-0678   °[facility]    (336) 272-1050   °[facility] ° [address], [place] Phone:  (336) 832-4444, Fax:  (336) 832-4440 Hours of Operation:  9 am - 6 pm, M-F.  Also accepts Medicaid/Medicare and self-pay.  °[facility] ° [address], [place] Phone: (336) [phone], Fax: (336) [phone]. Hours of Operation:  8:30 am - 5:30 pm, M-F.  Also accepts Medicaid and self-pay.  °[facility] [place] [address], [place] Phone: (336) 878-6027   °[facility] [address], [place], [place] (336)723-1848, Ext. 123 Mondays & Thursdays: 7-9 AM.  First 15 patients are seen on a first come, first serve basis. °  ° °Medicaid-accepting [place] Providers: ° °Organization         Address                                                                       Phone                               Notes  °[facility] [address], [place] (336) 641-2100 Also accepts self-pay patients.  °[facility] [address], [place] ° (336) 856-9996   °[facility] [address], [place] (336) 288-8857   °[facility] [address] [place] [address], [place] (336) 299-7000   °Veita Bland [address], [place]  ° (336) 373-1557 Only accepts [place] Access Medicaid patients after they have their name applied to their card.  ° °Self-Pay (no insurance) in [place]: °  °Organization         Address                                                     Phone               Notes  °Sickle Cell Patients, [facility] [address], [place] (336) 832-1970   °[facility] [facility] [address], [place] (336) 832-4400   °[facility] ° [address], [place] (336) 992-4800   °[facility]/Dr. Osei-Bonsu ° [address] [place] [address], [place] or [address], [place] (336) [phone] Phone number for both [place] and [place] locations is the same.  °[facility] [address], [place] (336) 299-0000   °[facility] [address] [place] [address], [place] or [address] (336) 852-7530 °(336) 878-2260   °[facility] [address],  (336) 350-1642, phone; (336) 294-5005, fax Sees patients 1st and 3rd Saturday of   every month.  Must not qualify for public or private insurance (i.e. Medicaid, Medicare, [place] Health Choice, Veterans' Benefits)  Household income should be no more than 200% of the poverty level The clinic cannot treat you if you are pregnant or think you are pregnant  Sexually transmitted diseases are not treated at the clinic.    Dental Care: Organization         Address                                  Phone                       Notes  [facility] (226) 735-2886 Accepts children up to age 9 who are enrolled in [place] or [place]; pregnant women with a Medicaid card; and children who have applied for Medicaid or [place] Health Choice, but were declined, whose parents can pay a reduced fee at time of service.  [facility] Department of [facility]  [address], [place] [phone] Accepts children up to age 24 who are enrolled in [place] or [place]; pregnant women with a Medicaid card; and children who have applied for Medicaid or [place] Health Choice, but were declined, whose parents can pay a reduced fee at time of service.  [facility]  [place] [phone] Patients are seen by appointment only. Walk-ins are not accepted. [place] will see patients 45 years of age and older. Monday - Tuesday (8am-5pm) Most Wednesdays (8:30-5pm) $30 per visit, cash only  [facility] Adult [facility] Access PRO[facility]  [address], [facility] [phone] Patients are seen by appointment only. Walk-ins are not accepted. [place] will see patients 27 years of age and older. One Wednesday Evening (Monthly: Volunteer Based).  $30 per visit, cash only  [place]  [phone] for adults; Children under age 15, call Graduate Pediatric Dentistry at [phone]. Children aged 75-14, please call [phone] to request a pediatric application.  Dental services are provided in all areas of dental care including fillings, crowns and bridges, complete and partial dentures, implants, gum treatment, root canals, and extractions. Preventive care is also provided. Treatment is provided to both adults and children. Patients are selected via a lottery and there is often a waiting list.   [facility] [address], [place]  [phone] [email or web address]   [facility] [address] [place], [place] [phone], Ext. 123 Second and Fourth Thursday of each month, opens at 6:30 AM; Clinic ends at 9 AM.  Patients are seen on a first-come first-served basis, and a limited number are seen during each clinic.   [facility]  [address] Hillard Danker [place], [place] [phone]   Eligibility Requirements You must have lived in [place], [place], or [place] counties for at least the last three months.   You cannot be eligible for state or federal sponsored [employer], including [employer], [place], or [employer].   You generally cannot be eligible for healthcare insurance through your employer.    How to apply: Eligibility screenings are held every Tuesday and Wednesday afternoon from 1:00 pm until 4:00 pm. You do not need an appointment for the interview!  [facility] [address], [place], [place] [phone]   [facility]  [phone]   [facility]  [phone]   [facility] Health Department  [phone]    Behavioral Health Resources in the Community: Intensive Outpatient Programs Organization         Address                                              Phone              Notes  [facility] [address], [place], [place] [phone]   [facility] Outpatient [address] [place], [place] [phone]   [facility] [address] [place], [place]  [phone]   [facility] Health [address],  [place], [place] 3-875-643-3295 or 740-603-6540   Substance Abuse Resources Organization         Address                                Phone  Notes  [facility]  (402)261-0542   [facility]  6824877662   [facility] [place]  [phone]   Floydene Flock  (831) 098-8288   Residential & Outpatient Substance Abuse Program  (941)672-3485   Psychological  Services Organization         Address                                  Phone                Notes  [facility]  336(279)[phone]   [facility]  [phone]   [facility] [address] [place] [phone] or (615)[phone]    Mobile Crisis Teams Organization         Address  Phone  Notes  [facility], Mobile Crisis Care Unit  [phone]   [facility]  [address] [place], [place] [phone]   Doristine Locks [address] [place] [phone]    Self-Help/Support Groups Organization         Address                         Phone             Notes  [facility] [place] - variety of support groups  336- I7437963 Call for more information  Narcotics Anonymous (NA), [facility] [address], [employer] [place]  2 meetings at this location   Statistician         Address                                                    Phone              Notes  [facility] [address],    [place]  [phone]   [facility]  [address] [place] [address],  [place], [place] [phone]   [facility] [address] [place], [place] [phone] Admissions: 8am-3pm M-F  [facility] [address],    [place], [place] [phone]   [facility] [address] [place], [place], [place] [phone]   [facility] [address],  [place], [place] [phone]   [facility] - Intensive Outpatient [address], [place], [place] [phone]   [facility] ([facility] [address] [place]  [place], [place] 6-387-564-3329 or (931)666-4344   [facility] ([facility]) [address] [place], [place] [phone] Accepts Medicaid  [facility] [address]  [place] 3-557-322-0254 Substance Abuse/Addiction Treatment    [facility] Organization         Address                                                            Phone                    Notes  [facility]  [phone]   Angie Fava, PhD [address] [place]vin Knack [place], [place]   (631)818-3122 or [phone]   [facility]   [address] [place], [place] [phone]   [facility] [address] [place], [place] [phone] Insurance/Medicaid/sponsorship through [facility] and Families [address]                                    [place], [place] [phone] Therapy/tele-psych/case  [facility] [address], [place] [phone]    Dr. Lolly Mustache  [phone]   [facility]  [facility] Dept. 1) [address] [place] 2) [address] [place] 3)  [address], [place] [phone]  [phone]   [facility] Child Abuse Hotline (408) 165-5443 or (215)311-3012 (After Hours)        Insomnia Insomnia is a sleep disorder that makes it difficult to fall asleep or to stay asleep. Insomnia can cause tiredness (fatigue), low energy, difficulty concentrating, mood swings, and poor performance at work or school.  There are three different ways to classify insomnia:  Difficulty falling asleep.  Difficulty staying asleep.  Waking up too early in the morning. Any type of insomnia can be long-term (chronic) or short-term (acute). Both are common. Short-term insomnia usually lasts for three months or less. Chronic insomnia occurs at least three times a week for longer than three months. CAUSES  Insomnia may be caused by another condition, situation, or substance, such as:  Anxiety.  Certain medicines.  Gastroesophageal reflux disease (GERD) or other gastrointestinal conditions.  Asthma or other breathing conditions.  Restless legs syndrome, sleep apnea, or  other sleep disorders.  Chronic pain.  Menopause. This may include hot flashes.  Stroke.  Abuse of alcohol, tobacco, or illegal drugs.  Depression.  Caffeine.   Neurological disorders, such as Alzheimer disease.  An overactive thyroid (hyperthyroidism). The cause of insomnia may not be known. RISK FACTORS Risk factors for insomnia include:  Gender. Women  are more commonly affected than men.  Age. Insomnia is more common as you get older.  Stress. This may involve your professional or personal life.  Income. Insomnia is more common in people with lower income.  Lack of exercise.   Irregular work schedule or night shifts.  Traveling between different time zones. SIGNS AND SYMPTOMS If you have insomnia, trouble falling asleep or trouble staying asleep is the main symptom. This may lead to other symptoms, such as:  Feeling fatigued.  Feeling nervous about going to sleep.  Not feeling rested in the morning.  Having trouble concentrating.  Feeling irritable, anxious, or depressed. TREATMENT  Treatment for insomnia depends on the cause. If your insomnia is caused by an underlying condition, treatment will focus on addressing the condition. Treatment may also include:   Medicines to help you sleep.  Counseling or therapy.  Lifestyle adjustments. HOME CARE INSTRUCTIONS   Take medicines only as directed by your health care provider.  Keep regular sleeping and waking hours. Avoid naps.  Keep a sleep diary to help you and your health care provider figure out what could be causing your insomnia. Include:   When you sleep.  When you wake up during the night.  How well you sleep.   How rested you feel the next day.  Any side effects of medicines you are taking.  What you eat and drink.   Make your bedroom a comfortable place where it is easy to fall asleep:  Put up shades or special blackout curtains to block light from outside.  Use a white noise  machine to block noise.  Keep the temperature cool.   Exercise regularly as directed by your health care provider. Avoid exercising right before bedtime.  Use relaxation techniques to manage stress. Ask your health care provider to suggest some techniques that may work well for you. These may include:  Breathing exercises.  Routines to release muscle tension.  Visualizing peaceful scenes.  Cut back on alcohol, caffeinated beverages, and cigarettes, especially close to bedtime. These can disrupt your sleep.  Do not overeat or eat spicy foods right before bedtime. This can lead to digestive discomfort that can make it hard for you to sleep.  Limit screen use before bedtime. This includes:  Watching TV.  Using your smartphone, tablet, and computer.  Stick to a routine. This can help you fall asleep faster. Try to do a quiet activity, brush your teeth, and go to bed at the same time each night.  Get out of bed if you are still awake after 15 minutes of trying to sleep. Keep the lights down, but try reading or doing a quiet activity. When you feel sleepy, go back to bed.  Make sure that you drive carefully. Avoid driving if you feel very sleepy.  Keep all follow-up appointments as directed by your health care provider. This is important. SEEK MEDICAL CARE IF:   You are tired throughout the day or have trouble in your daily routine due to sleepiness.  You continue to have sleep problems or your sleep problems get worse. SEEK IMMEDIATE MEDICAL CARE IF:   You have serious thoughts about hurting yourself or someone else.   This information is not intended to replace advice given to you by your health care provider. Make sure you discuss any questions you have with your health care provider.   Document Released: 03/22/2000 Document Revised: 12/14/2014 Document Reviewed: 12/24/2013 Elsevier Interactive Patient Education 2016 Elsevier Inc.  Panic Attacks Panic  attacks are sudden,  short feelings of great fear or discomfort. You may have them for no reason when you are relaxed, when you are uneasy (anxious), or when you are sleeping.  HOME CARE  Take all your medicines as told.  Check with your doctor before starting new medicines.  Keep all doctor visits. GET HELP IF:  You are not able to take your medicines as told.  Your symptoms do not get better.  Your symptoms get worse. GET HELP RIGHT AWAY IF:  Your attacks seem different than your normal attacks.  You have thoughts about hurting yourself or others.  You take panic attack medicine and you have a side effect. MAKE SURE YOU:  Understand these instructions.  Will watch your condition.  Will get help right away if you are not doing well or get worse.   This information is not intended to replace advice given to you by your health care provider. Make sure you discuss any questions you have with your health care provider.   Document Released: 04/27/2010 Document Revised: 01/13/2013 Document Reviewed: 11/06/2012 Elsevier Interactive Patient Education [employer]2016 Elsevier Inc.

## 2015-10-03 NOTE — Discharge Instructions (Signed)
Take the prescribed medication as directed. Follow-up with your psychiatrist as scheduled. Recommend that you follow-up with your primary care doctor if symptoms remain uncontrolled prior to your psychiatry appt. Return to the ED for new concerns.

## 2015-10-03 NOTE — ED Notes (Signed)
Pt. Coming from home via POV for increased stress and anxiety. Pt. sts she hasn't been able to eat or drink. Pt. Reports she has a psychiatrist appt. In 3 weeks. Pt. Tearful.
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
## 2015-10-03 NOTE — ED Notes (Signed)
Pt c/o anxiety with "a lot going on", decreased appetite and insomnia with a 20lb weight loss in the past month. Reports her PCP cannot see her until next week. She has been seen @ [facility] today and the urgent care yesterday as well. Prescribed Klonopin and vistaril which are not helping.

## 2015-10-03 NOTE — ED Provider Notes (Signed)
CSN: [id number]     Arrival date & time 10/03/15  1627 History   First MD Initiated Contact with Patient 10/03/15 1641     Chief Complaint  Patient presents with  . Anxiety   (Consider location/radiation/quality/duration/timing/severity/associated sxs/prior Treatment) HPI  Deanna Marshall is a 50 y.o. female presenting to UC with c/o anxiety due to having "a lot going on" including being laid off and stress at home.  She has had decreased appetite and insomnia with a 20lb weight loss over the last 1 month.  She states she cannot f/u with her PCP until next week and cannot f/u with her Psychiatrist, Dr. Marva PandaSerenity for another 3 weeks. Pt was [facility] at [facility] yesterday and today as well as [facility].  She was prescribed Klonopin yesterday and Vistaril today but notes neither is helping with her anxiety and insomnia. Denies SI/HI.  Pt notes she did go to [place] behavioral health [facility] [place] but they could only offer her an inpatient program. Pt does not believe she would benefit from inpatient care.  Pt also reports knowing about [facility] but not wanting to go.  Pt requesting something different for her anxiety and insomnia.     Past Medical History  Diagnosis Date  . Asthma   . Chronic back pain   . Anxiety   . COPD (chronic obstructive pulmonary disease) (HCC)   . Kidney stones    Past Surgical History  Procedure Laterality Date  . Back surgery    . Abdominal hysterectomy    . Insertion of mesh     Family History  Problem Relation Age of Onset  . Cancer Mother   . Diabetes Mother   . Hypertension Mother   . Mental illness Mother   . Cancer Father   . Diabetes Other   . Hypertension Other    Social History  Substance Use Topics  . Smoking status: Current Every Day Smoker -- 0.50 packs/day for 20 years    Types: Cigarettes  . Smokeless tobacco: Never Used  . Alcohol Use: No   OB History    No data available     Review of Systems   Constitutional: Positive for appetite change and unexpected weight change. Negative for fatigue.  Cardiovascular: Negative for chest pain and palpitations.  Neurological: Negative for weakness and numbness.  Psychiatric/Behavioral: Positive for sleep disturbance. Negative for suicidal ideas and self-injury. The patient is nervous/anxious.     Allergies  Compazine and Ketorolac tromethamine  Home Medications   Prior to Admission medications   Medication Sig Start Date End Date Taking? Authorizing Provider  acetaminophen (TYLENOL) 325 MG tablet Take 650 mg by mouth every 6 (six) hours as needed.    Historical Provider, MD  albuterol (PROVENTIL HFA;VENTOLIN HFA) 108 (90 Base) MCG/ACT inhaler Inhale 2 puffs into the lungs every 4 (four) hours as needed for wheezing or shortness of breath. 08/14/15   Shawn C Joy, PA-C  ALBUTEROL IN Inhale into the lungs.    Historical Provider, MD  ALPRAZolam Prudy Feeler(XANAX) 1 MG tablet Take 1 tablet (1 mg total) by mouth at bedtime as needed for anxiety. 09/19/15   Wallis BambergMario Mani, PA-C  clonazePAM (KLONOPIN) 0.5 MG tablet Take 1 tablet (0.5 mg total) by mouth 2 (two) times daily as needed for anxiety. 10/02/15   Linna HoffJames D Kindl, MD  hydrOXYzine (ATARAX/VISTARIL) 25 MG tablet Take 1 tablet (25 mg total) by mouth every 8 (eight) hours as needed for anxiety. 10/03/15  Garlon HatchetLisa M Sanders, PA-C  zolpidem (AMBIEN) 10 MG tablet Take 10 mg by mouth at bedtime as needed for sleep.    Historical Provider, MD   Meds Ordered and Administered this Visit  Medications - No data to display  BP 94/65 mmHg  Pulse 99  Resp 16  Wt 134 lb (60.782 kg)  SpO2 99% No data found.   Physical Exam  Constitutional: She is oriented to person, place, and time. She appears well-developed and well-nourished.  HENT:  Head: Normocephalic and atraumatic.  Eyes: EOM are normal.  Neck: Normal range of motion.  Cardiovascular: Normal rate.   Pulmonary/Chest: Effort normal. No respiratory distress.   Musculoskeletal: Normal range of motion.  Neurological: She is alert and oriented to person, place, and time.  Skin: Skin is warm and dry.  Psychiatric: Her speech is normal and behavior is normal. She exhibits a depressed mood. She expresses no homicidal and no suicidal ideation.  Nursing note and vitals reviewed.   ED Course  Procedures (including critical care time)  Labs Review Labs Reviewed - No data to display  Imaging Review Dg Chest 2 View  10/02/2015  CLINICAL DATA:  Shortness of breath.  Anxiety. EXAM: CHEST  2 VIEW COMPARISON:  Aug 14, 2015 FINDINGS: Lungs are clear. Heart size and pulmonary vascularity are normal. No adenopathy. No bone lesions. There are surgical clips in the upper abdomen. IMPRESSION: No edema or consolidation. Electronically Signed   By: Bretta BangWilliam  Woodruff III M.D.   On: 10/02/2015 13:30     MDM   1. Anxiety and depression   2. Insomnia    Pt requesting medication other than Klonopin or Vistaril to help with her anxiety, depression and insomnia.  Pt denies SI/HI.  Reviewed [place] Controlled Substance Database as well as medical records. Pt is currently on Suboxone, last refilled on 09/19/15 for a 6 day supply.  She was also prescribed 20 day supply of Zolpidem on 6/24 and 10 day supply of Klonopin on 10/02/15. Advised pt no additional medication would be prescribed today.   Called [facility] Health [facility]. Pt has been dismissed from that office due to non-compliance.    Offered pt multiple resources for both inpatient and outpatient care [facility] including number for Mobile Crisis team.  Also reminded pt she can always go [facility] or [facility] [place] if she decides she would like in-patient care. Patient verbalized understanding and agreement with treatment plan.     Junius Finnerrin O'Malley, PA-C 10/03/15 1731

## 2015-11-17 ENCOUNTER — Encounter (HOSPITAL_COMMUNITY): Payer: Self-pay | Admitting: *Deleted

## 2015-11-17 ENCOUNTER — Emergency Department (HOSPITAL_COMMUNITY)
Admission: EM | Admit: 2015-11-17 | Discharge: 2015-11-17 | Disposition: A | Discharge status: Home / Self Care | Payer: Medicare Other | Attending: Emergency Medicine | Admitting: Emergency Medicine

## 2015-11-17 DIAGNOSIS — J45909 Unspecified asthma, uncomplicated: Secondary | ICD-10-CM | POA: Diagnosis not present

## 2015-11-17 DIAGNOSIS — T507X1A Poisoning by analeptics and opioid receptor antagonists, accidental (unintentional), initial encounter: Secondary | ICD-10-CM | POA: Diagnosis not present

## 2015-11-17 DIAGNOSIS — J449 Chronic obstructive pulmonary disease, unspecified: Secondary | ICD-10-CM | POA: Diagnosis not present

## 2015-11-17 DIAGNOSIS — F17210 Nicotine dependence, cigarettes, uncomplicated: Secondary | ICD-10-CM | POA: Diagnosis not present

## 2015-11-17 DIAGNOSIS — Z79899 Other long term (current) drug therapy: Secondary | ICD-10-CM | POA: Diagnosis not present

## 2015-11-17 DIAGNOSIS — T50901A Poisoning by unspecified drugs, medicaments and biological substances, accidental (unintentional), initial encounter: Secondary | ICD-10-CM

## 2015-11-17 DIAGNOSIS — R5383 Other fatigue: Secondary | ICD-10-CM | POA: Diagnosis present

## 2015-11-17 LAB — RAPID URINE DRUG SCREEN, HOSP PERFORMED
Amphetamines: NOT DETECTED
Barbiturates: NOT DETECTED
Benzodiazepines: POSITIVE — AB
Cocaine: NOT DETECTED
Opiates: NOT DETECTED
Tetrahydrocannabinol: NOT DETECTED

## 2015-11-17 LAB — ETHANOL: Alcohol, Ethyl (B): <5 mg/dL (ref <5)

## 2015-11-17 LAB — URINALYSIS, ROUTINE W REFLEX MICROSCOPIC
Bilirubin Urine: NEGATIVE
Glucose, UA: NEGATIVE mg/dL
Hgb urine dipstick: NEGATIVE
Ketones, ur: NEGATIVE mg/dL
Leukocytes, UA: NEGATIVE
Nitrite: NEGATIVE
Protein, ur: NEGATIVE mg/dL
Specific Gravity, Urine: 1.01 (ref 1.005–1.030)
pH: 6.5 (ref 5.0–8.0)

## 2015-11-17 LAB — COMPREHENSIVE METABOLIC PANEL
ALT: 15 U/L (ref 14–54)
AST: 20 U/L (ref 15–41)
Albumin: 3.8 g/dL (ref 3.5–5.0)
Alkaline Phosphatase: 77 U/L (ref 38–126)
Anion gap: 6 (ref 5–15)
BUN: 17 mg/dL (ref 6–20)
CO2: 27 mmol/L (ref 22–32)
Calcium: 9.1 mg/dL (ref 8.9–10.3)
Chloride: 105 mmol/L (ref 101–111)
Creatinine, Ser: 0.71 mg/dL (ref 0.44–1.00)
GFR calc Af Amer: >60 mL/min (ref >60)
GFR calc non Af Amer: >60 mL/min (ref >60)
Glucose, Bld: 69 mg/dL (ref 65–99)
Potassium: 3.9 mmol/L (ref 3.5–5.1)
Sodium: 138 mmol/L (ref 135–145)
Total Bilirubin: 1 mg/dL (ref 0.3–1.2)
Total Protein: 6.7 g/dL (ref 6.5–8.1)

## 2015-11-17 LAB — CBC
HCT: 42 % (ref 36.0–46.0)
Hemoglobin: 14.5 g/dL (ref 12.0–15.0)
MCH: 31.4 pg (ref 26.0–34.0)
MCHC: 34.5 g/dL (ref 30.0–36.0)
MCV: 90.9 fL (ref 78.0–100.0)
Platelets: 191 10*3/uL (ref 150–400)
RBC: 4.62 MIL/uL (ref 3.87–5.11)
RDW: 12.2 % (ref 11.5–15.5)
WBC: 8.9 10*3/uL (ref 4.0–10.5)

## 2015-11-17 LAB — ACETAMINOPHEN LEVEL: Acetaminophen (Tylenol), Serum: <10 ug/mL — ABNORMAL LOW (ref 10–30)

## 2015-11-17 LAB — SALICYLATE LEVEL: Salicylate Lvl: <4 mg/dL (ref 2.8–30.0)

## 2015-11-17 LAB — CBG MONITORING, ED
Glucose-Capillary: 56 mg/dL — ABNORMAL LOW (ref 65–99)
Glucose-Capillary: 129 mg/dL — ABNORMAL HIGH (ref 65–99)

## 2015-11-17 MED ORDER — DEXTROSE 50 % IV SOLN
INTRAVENOUS | Status: AC
  Administered 2015-11-17: 50 mL
  Filled 2015-11-17: qty 50

## 2015-11-17 MED ORDER — NALOXONE HCL 2 MG/2ML IJ SOSY
1.0000 mg | PREFILLED_SYRINGE | Freq: Once | INTRAMUSCULAR | Status: AC
  Administered 2015-11-17: 1 mg via INTRAVENOUS
  Filled 2015-11-17: qty 2

## 2015-11-17 NOTE — ED Notes (Signed)
Walked patient to restroom. I asked patient to give urine sample. She urinated in toilet and refused to give sample.She threw cup in trash.

## 2015-11-17 NOTE — ED Provider Notes (Signed)
[facility]-EMERGENCY DEPT Provider Note   CSN: [id number] Arrival date & time: 11/17/15  1408  First Provider Contact:  First MD Initiated Contact with Patient 11/17/15 1432        History   Chief Complaint Chief Complaint  Patient presents with  . Drug Overdose    HPI Deanna Marshall is a 50 y.o. female.  HPI Patient is brought in by EMS after being found lethargic and difficult to arouse in [employer]. Was given Narcan 1 mg with improvement of mental status. Patient is now awake and appears drowsy. She is not forthcoming with information. Level V caveat. She does deny taking any drugs or using any alcohol today. States she did take an Ambien last night. States she thinks she was seen at [facility] in the emergency department yesterday. She was seen there for head trauma. She is unable to give any details regarding this. Past Medical History:  Diagnosis Date  . Anxiety   . Asthma   . Chronic back pain   . COPD (chronic obstructive pulmonary disease) (HCC)   . Kidney stones     Patient Active Problem List   Diagnosis Date Noted  . Panic anxiety syndrome 03/14/2015  . Depression 03/14/2015  . Opiate dependence, continuous (HCC) 05/13/2014  . Benzodiazepine dependence, continuous (HCC) 05/13/2014  . LEG PAIN, BILATERAL 07/19/2009  . MUSCLE PAIN 03/13/2009  . VERTIGO 03/13/2009  . NAUSEA 03/13/2009  . HEADACHE 02/15/2009  . LEG CRAMPS 01/20/2009  . ANXIETY 12/09/2008  . ASTHMA 12/09/2008    Past Surgical History:  Procedure Laterality Date  . ABDOMINAL HYSTERECTOMY    . BACK SURGERY    . INSERTION OF MESH      OB History    No data available       Home Medications    Prior to Admission medications   Medication Sig Start Date End Date Taking? Authorizing Provider  acetaminophen (TYLENOL) 325 MG tablet Take 325 mg by mouth every 6 (six) hours as needed for mild pain, moderate pain or headache.    Yes Historical Provider, MD  albuterol (PROVENTIL HFA;VENTOLIN HFA) 108 (90  Base) MCG/ACT inhaler Inhale 2 puffs into the lungs every 4 (four) hours as needed for wheezing or shortness of breath. 08/14/15  Yes Shawn C Joy, PA-C  albuterol (PROVENTIL) (2.5 MG/3ML) 0.083% nebulizer solution Take 2.5 mg by nebulization every 4 (four) hours as needed for wheezing or shortness of breath.   Yes Historical Provider, MD  ALPRAZolam Prudy Feeler) 1 MG tablet Take 1 tablet (1 mg total) by mouth at bedtime as needed for anxiety. 09/19/15  Yes Wallis Bamberg, PA-C  Fluticasone-Salmeterol (ADVAIR) 250-50 MCG/DOSE AEPB Inhale 1 puff into the lungs 2 (two) times daily.   Yes Historical Provider, MD  zolpidem (AMBIEN) 10 MG tablet Take 10 mg by mouth at bedtime as needed for sleep.   Yes Historical Provider, MD  clonazePAM (KLONOPIN) 0.5 MG tablet Take 1 tablet (0.5 mg total) by mouth 2 (two) times daily as needed for anxiety. Patient not taking: Reported on 11/17/2015 10/02/15   Linna Hoff, MD  hydrOXYzine (ATARAX/VISTARIL) 25 MG tablet Take 1 tablet (25 mg total) by mouth every 8 (eight) hours as needed for anxiety. Patient not taking: Reported on 11/17/2015 10/03/15   Garlon Hatchet, PA-C    Family History Family History  Problem Relation Age of Onset  . Cancer Father   . Cancer Mother   . Diabetes Mother   . Hypertension Mother   .  Mental illness Mother   . Diabetes Other   . Hypertension Other     Social History Social History  Substance Use Topics  . Smoking status: Current Every Day Smoker    Packs/day: 0.50    Years: 20.00    Types: Cigarettes  . Smokeless tobacco: Never Used  . Alcohol use No     Allergies   Compazine [prochlorperazine]; Ivp dye [iodinated diagnostic agents]; and Ketorolac tromethamine   Review of Systems Review of Systems  Unable to perform ROS: Mental status change     Physical Exam Updated Vital Signs BP 119/85   Pulse 73   Temp 97.5 F (36.4 C) (Axillary)   Resp 18   SpO2 93%   Physical Exam  Constitutional: She is oriented to person,  place, and time. She appears well-developed and well-nourished. No distress.  Drowsy  HENT:  Head: Normocephalic and atraumatic.  Mouth/Throat: Oropharynx is clear and moist.  No obvious head or neck trauma.  Eyes: EOM are normal. Pupils are equal, round, and reactive to light.  Pupils are 3 mm and minimally reactive.  Neck: Normal range of motion. Neck supple.  No meningismus  Cardiovascular: Normal rate and regular rhythm.   Pulmonary/Chest: Effort normal. She has wheezes.  Diffuse extra wheezing. No respiratory distress.  Abdominal: Soft. Bowel sounds are normal. There is no tenderness. There is no rebound and no guarding.  Musculoskeletal: Normal range of motion. She exhibits no edema or tenderness.  Neurological: She is oriented to person, place, and time.  Patient appears to be moving all extremities without any focal deficit. Sensation is grossly intact.  Skin: Skin is warm and dry. No rash noted. No erythema.  Patient does have multiple puncture marks on the left upper extremity. No evidence of erythema, warmth or abscess.   Psychiatric:  Flat affect.  Nursing note and vitals reviewed.    ED Treatments / Results  Labs (all labs ordered are listed, but only abnormal results are displayed) Labs Reviewed  ACETAMINOPHEN LEVEL - Abnormal; Notable for the following:       Result Value   Acetaminophen (Tylenol), Serum <10 (*)    All other components within normal limits  URINE RAPID DRUG SCREEN, HOSP PERFORMED - Abnormal; Notable for the following:    Benzodiazepines POSITIVE (*)    All other components within normal limits  CBG MONITORING, ED - Abnormal; Notable for the following:    Glucose-Capillary 56 (*)    All other components within normal limits  CBG MONITORING, ED - Abnormal; Notable for the following:    Glucose-Capillary 129 (*)    All other components within normal limits  COMPREHENSIVE METABOLIC PANEL  ETHANOL  SALICYLATE LEVEL  CBC  URINALYSIS, ROUTINE W  REFLEX MICROSCOPIC (NOT AT [facility])    EKG  EKG Interpretation  Date/Time:  Friday November 17 2015 14:20:57 EDT Ventricular Rate:  82 PR Interval:    QRS Duration: 96 QT Interval:  341 QTC Calculation: 399 R Axis:   30 Text Interpretation:  Sinus rhythm Confirmed by Ranae PalmsYELVERTON  MD, Kasir Hallenbeck ([id number]) on 11/17/2015 2:35:04 PM       Radiology No results found.  Procedures Procedures (including critical care time)  Medications Ordered in ED Medications  dextrose 50 % solution (50 mLs  Given 11/17/15 1445)  naloxone (NARCAN) injection 1 mg (1 mg Intravenous Given 11/17/15 1652)     Initial Impression / Assessment and Plan / ED Course  I have reviewed the triage vital signs and the  nursing notes.  Pertinent labs & imaging results that were available during my care of the patient were reviewed by me and considered in my medical decision making (see chart for details).  Clinical Course  Value Comment By Time  Potassium: 3.9 (Reviewed) Loren Racer, MD 08/11 [phone]   Patient is now more alert. Ambulates to the restroom without any difficulty. Loren Racer, MD 08/11 [phone]  Patient has a history of benzodiazepine and opioid dependence. Per her doctor's note which she saw yesterday she visits multiple physicians and emergency departments seeking benzodiazepines. She was given a prescription for Zofran yesterday for intermittent nausea and vomiting. She's had recent prescriptions filled for this Suboxone, Ambien and Vistaril. Blood sugar noted to be in the 50s. Given D50. Will continue to observe her closely.  Patient becoming more lethargic. Heart rate has decreased as well as blood pressure with systolic into the 90s. Her O2 saturation is 92% on room air. We will re-dose Narcan and observe closely  1658  given 1 mg of Narcan. Improvement of heart rate, saturations, blood pressure and alertness.  Patient has been observed for 3 hours after last Narcan dose and greater than 5 hours in the  emergency department.. Remains alert. Stable blood pressure and oxygen saturations. Denies intentional overdose. Denies suicidal ideation. Offered polysubstance abuse treatment referral which the patient refused. States she has a Warden/ranger that she sees regularly. The patient is stable to be discharged home. She's been given return precautions as voice understanding. Final Clinical Impressions(s) / ED Diagnoses   Final diagnoses:  Drug overdose, accidental or unintentional, initial encounter    New Prescriptions New Prescriptions   No medications on file     Loren Racer, MD 11/17/15 2013

## 2015-11-17 NOTE — ED Notes (Signed)
Pt was notified about Urinalysis 

## 2015-11-17 NOTE — ED Notes (Signed)
MD at bedside. 

## 2015-11-17 NOTE — Progress Notes (Signed)
Patient noted to have had 6 ED visits within the last six months. Patient noted to have Medicare A and B insurance. Patient reports [facility] [place]. She reports she has a therapist named Dr. [place]arel [place]Judy in [place].  EDCM unable to locate this provider.

## 2015-11-17 NOTE — ED Notes (Signed)
Pt refusing to Urinate in sample cup. Throws sample cup away and urinates in toilet instead even after being informed we needed to collect sample.

## 2015-11-17 NOTE — ED Notes (Signed)
Pt tried to use the restroom to give a urine sample, wasn't able to but said that she did. RN notified

## 2015-11-17 NOTE — ED Notes (Signed)
Asked pt again if she took any thing today.  Pt continues to deny at this time. Attempted to explain to pt that it was important to be truthful about any medications she has taken, pt screams, "I'm not lying!"

## 2015-11-17 NOTE — ED Triage Notes (Signed)
Per EMS report: pt was found [facility] at [facility] very lethargic and hard to arouse.  EMS found pt's respirations to be 8.  Pt given 1mg  of narcan and pt became more responsive.  Pt denies taking anything today.  EMS noted red marks on arms suspicious for IV use.

## 2015-11-17 NOTE — ED Notes (Signed)
Bed: WA17 Expected date:  Expected time:  Means of arrival:  Comments: 1F/OD/narcan

## 2015-11-17 NOTE — ED Notes (Signed)
MD at bedside. Dr. Ranae PalmsYelverton at bedside.

## 2015-11-17 NOTE — ED Notes (Signed)
Dr Yelverton at bedside.  

## 2016-01-13 ENCOUNTER — Ambulatory Visit (HOSPITAL_COMMUNITY)
Admission: EM | Admit: 2016-01-13 | Discharge: 2016-01-13 | Disposition: A | Discharge status: Home / Self Care | Payer: Medicare Other

## 2016-01-14 ENCOUNTER — Ambulatory Visit (HOSPITAL_COMMUNITY)
Admission: EM | Admit: 2016-01-14 | Discharge: 2016-01-14 | Disposition: A | Discharge status: Home / Self Care | Payer: Medicare Other | Attending: Internal Medicine | Admitting: Internal Medicine

## 2016-01-14 ENCOUNTER — Encounter (HOSPITAL_COMMUNITY): Payer: Self-pay | Admitting: Emergency Medicine

## 2016-01-14 DIAGNOSIS — G4700 Insomnia, unspecified: Secondary | ICD-10-CM

## 2016-01-14 DIAGNOSIS — F419 Anxiety disorder, unspecified: Secondary | ICD-10-CM

## 2016-01-14 NOTE — Discharge Instructions (Signed)
Contact the Psychiatrist tomorrow for further evaluation and treatment

## 2016-01-14 NOTE — ED Triage Notes (Signed)
Patient reports her daughter passed away three days ago. Since then has not slept or eaten. Feels shaky.

## 2016-01-14 NOTE — ED Provider Notes (Signed)
CSN: [id number]     Arrival date & time 01/14/16  1209 History   First MD Initiated Contact with Patient 01/14/16 1441     Chief Complaint  Patient presents with  . Anxiety   (Consider location/radiation/quality/duration/timing/severity/associated sxs/prior Treatment) 50 year old female presents with anxiety and difficulty sleeping for the past 3 nights. Her daughter passed away suddenly in a MVA 3 days ago. She has not slept or had anything to eat in the past 3 days. She denies any suicidal thoughts or injuring other people. She has a history of anxiety and insomnia and has been on Xanax and Ambien in the past. She has also been seen at this Urgent Care in June 2017 for anxiety and placed on Klonopin with minimal success. She has an appointment with a Psychiatrist in 2 weeks for further management.       Past Medical History:  Diagnosis Date  . Anxiety   . Asthma   . Chronic back pain   . COPD (chronic obstructive pulmonary disease) (HCC)   . Kidney stones    Past Surgical History:  Procedure Laterality Date  . ABDOMINAL HYSTERECTOMY    . BACK SURGERY    . INSERTION OF MESH     Family History  Problem Relation Age of Onset  . Cancer Father   . Cancer Mother   . Diabetes Mother   . Hypertension Mother   . Mental illness Mother   . Diabetes Other   . Hypertension Other    Social History  Substance Use Topics  . Smoking status: Current Every Day Smoker    Packs/day: 0.50    Years: 20.00    Types: Cigarettes  . Smokeless tobacco: Never Used  . Alcohol use No   OB History    No data available     Review of Systems  Constitutional: Positive for appetite change and fatigue. Negative for chills and fever.  Respiratory: Negative for chest tightness and shortness of breath.   Cardiovascular: Negative for chest pain.  Gastrointestinal: Negative for abdominal pain, diarrhea, nausea and vomiting.  Neurological: Negative for dizziness, seizures, weakness and numbness.   Psychiatric/Behavioral: Positive for dysphoric mood and sleep disturbance. Negative for agitation, hallucinations, self-injury and suicidal ideas. The patient is not hyperactive.     Allergies  Compazine [prochlorperazine]; Ivp dye [iodinated diagnostic agents]; and Ketorolac tromethamine  Home Medications   Prior to Admission medications   Medication Sig Start Date End Date Taking? Authorizing Provider  acetaminophen (TYLENOL) 325 MG tablet Take 325 mg by mouth every 6 (six) hours as needed for mild pain, moderate pain or headache.     Historical Provider, MD  albuterol (PROVENTIL HFA;VENTOLIN HFA) 108 (90 Base) MCG/ACT inhaler Inhale 2 puffs into the lungs every 4 (four) hours as needed for wheezing or shortness of breath. 08/14/15   Shawn C Joy, PA-C  albuterol (PROVENTIL) (2.5 MG/3ML) 0.083% nebulizer solution Take 2.5 mg by nebulization every 4 (four) hours as needed for wheezing or shortness of breath.    Historical Provider, MD  ALPRAZolam Prudy Feeler) 1 MG tablet Take 1 tablet (1 mg total) by mouth at bedtime as needed for anxiety. 09/19/15   Wallis Bamberg, PA-C  Fluticasone-Salmeterol (ADVAIR) 250-50 MCG/DOSE AEPB Inhale 1 puff into the lungs 2 (two) times daily.    Historical Provider, MD  zolpidem (AMBIEN) 10 MG tablet Take 10 mg by mouth at bedtime as needed for sleep.    Historical Provider, MD   Meds Ordered and Administered this  Visit  Medications - No data to display  BP 113/58 (BP Location: Right Arm)   Pulse 82   Temp 98.4 F (36.9 C) (Oral)   Resp 18   SpO2 99%  No data found.   Physical Exam  Constitutional: She is oriented to person, place, and time. She appears well-developed and well-nourished. She appears lethargic.  She is crying in the room.   HENT:  Head: Normocephalic and atraumatic.  Eyes: EOM are normal. Pupils are equal, round, and reactive to light.  Cardiovascular: Normal rate and regular rhythm.   Neurological: She is oriented to person, place, and time.  She has normal reflexes. She appears lethargic.  Skin: Skin is warm and dry.  Psychiatric: Judgment and thought content normal. Her speech is delayed. She is slowed. Cognition and memory are normal. She exhibits a depressed mood.    Urgent Care Course   Clinical Course    Procedures (including critical care time)  Labs Review Labs Reviewed - No data to display  Imaging Review No results found.   Visual Acuity Review  Right Eye Distance:   Left Eye Distance:   Bilateral Distance:    Right Eye Near:   Left Eye Near:    Bilateral Near:         MDM   1. Anxiety   2. Insomnia, unspecified type    Reviewed [place] Controlled Substance Registry. There are a few people listed with the same last name and birth date with different addresses. Discussed with patient that from the registry it appears that she is currently on Xanax 1mg  3 times a day and Ambien 10mg  nightly. These were filled on 10/4 (if registry is correct- she got #90 tablets of Xanax and #30 tablets of Ambien on 01/10/16 from Dr. Omelia BlackwaterHeaden). Reviewed case with Dr. Dayton ScrapeMurray. Discussed with patient that I would not be able to provide her with any controlled medication. Offered her Trazadone to help with sleep but she said she can not tolerate it due to headaches. Also offered Vistaril but she said that did not help in the past. Recommend Benadryl 50mg  tonight to try to help with sleep. Recommend she contact her Psychiatrist tomorrow morning to see if she can be seen any sooner or go to ER if anxiety/depression worsens or any suicidal ideations occur.     Sudie GrumblingAnn Berry Joie Hipps, NP 01/14/16 [phone]

## 2016-02-26 ENCOUNTER — Ambulatory Visit: Payer: Self-pay | Admitting: Family Medicine

## 2016-03-21 ENCOUNTER — Ambulatory Visit: Payer: Self-pay | Admitting: Family Medicine

## 2016-03-21 ENCOUNTER — Encounter: Payer: Self-pay | Admitting: Family Medicine

## 2016-03-25 ENCOUNTER — Telehealth: Payer: Self-pay | Admitting: Family Medicine

## 2016-03-25 ENCOUNTER — Ambulatory Visit (INDEPENDENT_AMBULATORY_CARE_PROVIDER_SITE_OTHER): Payer: Medicare Other | Admitting: Family Medicine

## 2016-03-25 ENCOUNTER — Encounter: Payer: Self-pay | Admitting: Family Medicine

## 2016-03-25 VITALS — BP 104/66 | HR 68 | Temp 98.6°F | Resp 16 | Ht 64.5 in | Wt 147.2 lb

## 2016-03-25 DIAGNOSIS — F1320 Sedative, hypnotic or anxiolytic dependence, uncomplicated: Secondary | ICD-10-CM | POA: Diagnosis not present

## 2016-03-25 DIAGNOSIS — J4540 Moderate persistent asthma, uncomplicated: Secondary | ICD-10-CM

## 2016-03-25 DIAGNOSIS — Z23 Encounter for immunization: Secondary | ICD-10-CM

## 2016-03-25 DIAGNOSIS — F319 Bipolar disorder, unspecified: Secondary | ICD-10-CM | POA: Diagnosis not present

## 2016-03-25 DIAGNOSIS — B9789 Other viral agents as the cause of diseases classified elsewhere: Secondary | ICD-10-CM

## 2016-03-25 DIAGNOSIS — J069 Acute upper respiratory infection, unspecified: Secondary | ICD-10-CM

## 2016-03-25 DIAGNOSIS — F411 Generalized anxiety disorder: Secondary | ICD-10-CM | POA: Diagnosis not present

## 2016-03-25 DIAGNOSIS — F1120 Opioid dependence, uncomplicated: Secondary | ICD-10-CM

## 2016-03-25 MED ORDER — FLUTICASONE-SALMETEROL 250-50 MCG/DOSE IN AEPB: 1.0000 | INHALATION_SPRAY | Freq: Two times a day (BID) | RESPIRATORY_TRACT | 3 refills | Status: DC

## 2016-03-25 NOTE — Telephone Encounter (Signed)
Per Jovita with [facility], they are unable to accept referral due to patient being dismissed from practice 06/01/15.  Patient was dismissed due to noncompliance with medications.

## 2016-03-25 NOTE — Progress Notes (Signed)
Pre visit review using our clinic review tool, if applicable. No additional management support is needed unless otherwise documented below in the visit note. 

## 2016-03-25 NOTE — Progress Notes (Signed)
Office Note 03/25/2016  CC:  Chief Complaint  Patient presents with  . Establish Care  . URI    x 1 days    HPI:  Deanna Marshall is a 50 y.o. White female who is here to establish care. Patient's most recent primary MD: "I haven't been to one in a while". Old records in EPIC/[facility] EMR were reviewed prior to or during today's visit.  Pt sees psychiatrist, Dr. Omelia BlackwaterHeaden but missed 2 appts so she says she can't go back.  Asks for referral to Dr. Sandria ManlyLove in [place].  In review of chart, psychiatric dx that are noted are bipolar I d/o, depression, GAD.   It is also commented on throughout her chart that she is a habitual seeker of benzodiazepines as well as rx sleep meds. Opioid dependence is also listed in her chart, and she was on suboxone at one point in time.  She remarks that lately she has had a mild ST and some sniffles, with some mild cough.  Denies SOB, wheezing, fevers, or myalgias/arthralgias with this illness.  Seems like this illness started in the last few days. She has no advair anymore but has albuterol but has not required any during this illness.  She continues to smoke.  Despite being told by my administrative staff that I will not be prescribing her any controlled substances, she proceeded to ask me for alprazolam and ambien "to bridge the gap until I can get in with the psychiatrist".  I told her that I would not prescribe any controlled substances for her.  Past Medical History:  Diagnosis Date  . Anxiety and depression   . Asthma    onset age 50 per pt  . Chronic back pain   . COPD (chronic obstructive pulmonary disease) (HCC)   . Drug dependency, combined (HCC)    benzo's and opioids  . Kidney stones   . Malingering   . Psychiatric pseudoseizure     Past Surgical History:  Procedure Laterality Date  . ABDOMINAL HYSTERECTOMY     ovaries still in.  DUB.  No malignancy.  Marland Kitchen. BACK SURGERY     L spine x 2.  No hardware.  . CHOLECYSTECTOMY  2012  . INSERTION OF MESH       Family History  Problem Relation Age of Onset  . Cancer Father   . Cancer Mother   . Diabetes Mother   . Hypertension Mother   . Mental illness Mother   . Diabetes Other   . Hypertension Other     Social History   Social History  . Marital status: Widowed    Spouse name: N/A  . Number of children: N/A  . Years of education: N/A   Occupational History  . Not on file.   Social History Main Topics  . Smoking status: Current Every Day Smoker    Packs/day: 0.50    Years: 20.00    Types: Cigarettes  . Smokeless tobacco: Never Used  . Alcohol use No  . Drug use: No  . Sexual activity: Yes    Birth control/ protection: Surgical   Other Topics Concern  . Not on file   Social History Narrative   Widow since 2008.  She has 2 children.   Educ: HS   Occup: Disability for chronic low back pain and psychiatric reasons.       Outpatient Encounter Prescriptions as of 03/25/2016  Medication Sig  . acetaminophen (TYLENOL) 325 MG tablet Take 325 mg by mouth every  6 (six) hours as needed for mild pain, moderate pain or headache.   . albuterol (PROVENTIL HFA;VENTOLIN HFA) 108 (90 Base) MCG/ACT inhaler Inhale 2 puffs into the lungs every 4 (four) hours as needed for wheezing or shortness of breath.  Marland Kitchen. albuterol (PROVENTIL) (2.5 MG/3ML) 0.083% nebulizer solution Take 2.5 mg by nebulization every 4 (four) hours as needed for wheezing or shortness of breath.  . ALPRAZolam (XANAX) 1 MG tablet Take 1 tablet (1 mg total) by mouth at bedtime as needed for anxiety.  . Fluticasone-Salmeterol (ADVAIR) 250-50 MCG/DOSE AEPB Inhale 1 puff into the lungs 2 (two) times daily.  Marland Kitchen. zolpidem (AMBIEN) 10 MG tablet Take 10 mg by mouth at bedtime as needed for sleep.  . [DISCONTINUED] Fluticasone-Salmeterol (ADVAIR) 250-50 MCG/DOSE AEPB Inhale 1 puff into the lungs 2 (two) times daily.   No facility-administered encounter medications on file as of 03/25/2016.     Allergies  Allergen Reactions  .  Compazine [Prochlorperazine] Hives  . Ivp Dye [Iodinated Diagnostic Agents] Swelling and Other (See Comments)    Reaction:  Facial swelling  . Ketorolac Tromethamine Swelling and Other (See Comments)    Reaction:  Facial swelling  . Morphine And Related Swelling    ROS Review of Systems  Constitutional: Negative for fatigue and fever.  HENT: Positive for congestion and sore throat.   Eyes: Negative for visual disturbance.  Respiratory: Positive for cough.   Cardiovascular: Negative for chest pain.  Gastrointestinal: Negative for abdominal pain and nausea.  Genitourinary: Negative for dysuria.  Musculoskeletal: Negative for joint swelling.  Skin: Negative for rash.  Neurological: Negative for weakness and headaches.  Hematological: Negative for adenopathy.  Psychiatric/Behavioral: The patient is nervous/anxious.     PE; Blood pressure 104/66, pulse 68, temperature 98.6 F (37 C), temperature source Oral, resp. rate 16, height 5' 4.5" (1.638 m), weight 147 lb 4 oz (66.8 kg), SpO2 99 %. Gen: Alert, well appearing.  Patient is oriented to person, place, time, and situation. AFFECT: pleasant, lucid thought and speech. HEENT: eyes without injection, drainage, or swelling.  Ears: EACs clear, TMs with normal light reflex and landmarks.  Nose: Clear rhinorrhea, with some dried, crusty exudate adherent to mildly injected mucosa.  No purulent d/c.  No paranasal sinus TTP.  No facial swelling.  Throat and mouth without focal lesion.  No pharyngial swelling, erythema, or exudate.   Neck: supple, no LAD.   LUNGS: CTA bilat, nonlabored resps.   CV: RRR, no m/r/g. EXT: no c/c/e SKIN: no rash  Pertinent labs:  None today  ASSESSMENT AND PLAN:   New pt; no old/prior PCP to obtain records from.  1) Viral URI with cough: symptomatic care discussed. Flu vaccine given today.  2) Asthma (?COPD?--also listed in chart): no sign of asthma exacerbation at this time. I renewed her advair rx so she  could restart this. Encouraged smoking cessation.  3) Multiple psychiatric dx: pt requested referral to [place] [facility], so I ordered this referral today. I declined to RF her xanax or zolpidem.  Flu vaccine given today.  An After Visit Summary was printed and given to the patient.  Return in about 3 months (around 06/23/2016) for annual CPE (fasting).  Signed:  Santiago BumpersPhil Ouita Nish, MD           03/25/2016

## 2016-03-26 NOTE — Telephone Encounter (Signed)
Noted. It is interesting that she told me Dr. Lilia ProHeaden's office would not see her anymore b/c she had missed too many appointments.

## 2016-03-26 NOTE — Telephone Encounter (Signed)
FYI

## 2016-03-26 NOTE — Telephone Encounter (Signed)
I called patient to offer to send referral to [facility]. Patient declined, said she was going to follow up with Dr. Nolon LennertHeadin.

## 2016-03-26 NOTE — Telephone Encounter (Signed)
OK. Pls call patient and tell her that if she can find a psychiatric office that will accept her, we'll make the referral.-thx

## 2016-04-11 ENCOUNTER — Telehealth: Payer: Self-pay | Admitting: Family Medicine

## 2016-04-11 NOTE — Telephone Encounter (Signed)
Terri L Slaugenhaupt, CCC-SLP  Diane S Tomerlin        FYI: We received a referral on your patient.  This referral is for a Psychiatrist, which we do not have at this office.  We called the patient and she confirmed - she needs a Therapist, sportssychiatrist.      Thank you for the referral

## 2016-04-11 NOTE — Telephone Encounter (Signed)
FYI

## 2016-04-12 NOTE — Telephone Encounter (Signed)
Pls call pt: she needs to find a psychiatrist's office that is accepting new pt's and then let us know the name of the office and we'll then order a referral.--thx

## 2016-04-12 NOTE — Telephone Encounter (Signed)
Patient notified and verbalized understanding. Patient will call back with psychiatrist.

## 2016-04-29 ENCOUNTER — Telehealth: Payer: Self-pay | Admitting: Family Medicine

## 2016-04-29 DIAGNOSIS — M545 Low back pain: Principal | ICD-10-CM

## 2016-04-29 DIAGNOSIS — G8929 Other chronic pain: Secondary | ICD-10-CM

## 2016-04-29 NOTE — Telephone Encounter (Signed)
Referral ordered as per pt request. 

## 2016-04-29 NOTE — Telephone Encounter (Signed)
Patient called and would like to have a referral to pain specialist in [place] due to pain in her back.  Please advise.

## 2016-05-14 ENCOUNTER — Telehealth: Payer: Self-pay | Admitting: Family Medicine

## 2016-05-14 NOTE — Telephone Encounter (Signed)
Pt is asking for an MRI, pt states with all that is has been going through and feeling dizzy that she would like to have this done.

## 2016-05-14 NOTE — Telephone Encounter (Signed)
Sorry, needs office visit so I can determine whether an MRI is appropriate or not.-thx

## 2016-05-14 NOTE — Telephone Encounter (Signed)
Please advise. Thanks.  

## 2016-05-15 NOTE — Telephone Encounter (Signed)
Pt scheduled appt 05/17/16 @ 9:30am.

## 2016-05-17 ENCOUNTER — Ambulatory Visit: Payer: Self-pay | Admitting: Family Medicine

## 2016-05-23 ENCOUNTER — Ambulatory Visit: Payer: Self-pay | Admitting: Family Medicine

## 2016-05-24 ENCOUNTER — Ambulatory Visit: Payer: Self-pay | Admitting: Family Medicine

## 2016-05-27 ENCOUNTER — Ambulatory Visit: Payer: Self-pay | Admitting: Family Medicine

## 2016-05-28 ENCOUNTER — Ambulatory Visit: Payer: Self-pay | Admitting: Family Medicine

## 2016-05-28 ENCOUNTER — Encounter: Payer: Self-pay | Admitting: Family Medicine

## 2016-06-20 ENCOUNTER — Telehealth: Payer: Self-pay

## 2016-06-20 NOTE — Telephone Encounter (Signed)
LM requesting CB regarding AWV. Requesting patient to have AWV with [facility] at 11am on 06/24/2016, after appointment for physical with PCP.

## 2016-06-24 ENCOUNTER — Ambulatory Visit: Payer: Self-pay | Admitting: Family Medicine

## 2016-06-24 NOTE — Progress Notes (Deleted)
Office Note 06/24/2016  CC: No chief complaint on file.   HPI:  Deanna Marshall is a 51 y.o. White female who is here for annual health maintenance exam.   Past Medical History:  Diagnosis Date  . Anxiety and depression   . Asthma    onset age 89 per pt  . Chronic back pain   . COPD (chronic obstructive pulmonary disease) (HCC)   . Drug dependency, combined (HCC)    benzo's and opioids  . Kidney stones   . Malingering   . Psychiatric pseudoseizure     Past Surgical History:  Procedure Laterality Date  . ABDOMINAL HYSTERECTOMY     ovaries still in.  DUB.  No malignancy.  Marland Kitchen BACK SURGERY     L spine x 2.  No hardware.  . CHOLECYSTECTOMY  2012  . INSERTION OF MESH      Family History  Problem Relation Age of Onset  . Cancer Father   . Cancer Mother   . Diabetes Mother   . Hypertension Mother   . Mental illness Mother   . Diabetes Other   . Hypertension Other     Social History   Social History  . Marital status: Widowed    Spouse name: N/A  . Number of children: N/A  . Years of education: N/A   Occupational History  . Not on file.   Social History Main Topics  . Smoking status: Current Every Day Smoker    Packs/day: 0.50    Years: 20.00    Types: Cigarettes  . Smokeless tobacco: Never Used  . Alcohol use No  . Drug use: No  . Sexual activity: Yes    Birth control/ protection: Surgical   Other Topics Concern  . Not on file   Social History Narrative   Widow since 2008.  She has 2 children.   Educ: HS   Occup: Disability for chronic low back pain and psychiatric reasons.       Outpatient Medications Prior to Visit  Medication Sig Dispense Refill  . acetaminophen (TYLENOL) 325 MG tablet Take 325 mg by mouth every 6 (six) hours as needed for mild pain, moderate pain or headache.     . albuterol (PROVENTIL HFA;VENTOLIN HFA) 108 (90 Base) MCG/ACT inhaler Inhale 2 puffs into the lungs every 4 (four) hours as needed for wheezing or shortness of  breath. 1 Inhaler 1  . albuterol (PROVENTIL) (2.5 MG/3ML) 0.083% nebulizer solution Take 2.5 mg by nebulization every 4 (four) hours as needed for wheezing or shortness of breath.    . ALPRAZolam (XANAX) 1 MG tablet Take 1 tablet (1 mg total) by mouth at bedtime as needed for anxiety. 1 tablet 0  . Fluticasone-Salmeterol (ADVAIR) 250-50 MCG/DOSE AEPB Inhale 1 puff into the lungs 2 (two) times daily. 60 each 3  . zolpidem (AMBIEN) 10 MG tablet Take 10 mg by mouth at bedtime as needed for sleep.     No facility-administered medications prior to visit.     Allergies  Allergen Reactions  . Compazine [Prochlorperazine] Hives  . Ivp Dye [Iodinated Diagnostic Agents] Swelling and Other (See Comments)    Reaction:  Facial swelling  . Ketorolac Tromethamine Swelling and Other (See Comments)    Reaction:  Facial swelling  . Morphine And Related Swelling    ROS *** PE; There were no vitals taken for this visit. *** Pertinent labs:  ***  ASSESSMENT AND PLAN:   No problem-specific Assessment & Plan notes found for this  encounter.   FOLLOW UP:  No Follow-up on file.

## 2016-06-27 ENCOUNTER — Encounter: Payer: Self-pay | Admitting: Family Medicine

## 2016-06-28 ENCOUNTER — Telehealth: Payer: Self-pay | Admitting: Family Medicine

## 2016-06-28 NOTE — Telephone Encounter (Signed)
Patient dismissed from [facility] by Santiago BumpersPhil McGowen MD , effective June 27, 2016. Dismissal letter sent out by certified / registered mail.  DAJ

## 2016-07-11 NOTE — Telephone Encounter (Signed)
Received signed domestic return receipt verifying delivery of certified letter on July 06, 2016. Article number [id number] DAJ

## 2016-07-11 NOTE — Telephone Encounter (Signed)
Noted  

## 2016-07-30 ENCOUNTER — Encounter: Payer: Self-pay | Admitting: Family Medicine

## 2016-08-02 ENCOUNTER — Encounter: Payer: Self-pay | Admitting: Family Medicine

## 2016-08-02 NOTE — Progress Notes (Deleted)
Office Note 08/02/2016  CC: No chief complaint on file.   HPI:  Deanna Marshall is a 51 y.o. White female who is here for annual health maintenance exam.   Past Medical History:  Diagnosis Date  . Anxiety and depression   . Asthma    onset age 66 per pt  . Chronic back pain   . COPD (chronic obstructive pulmonary disease) (HCC)   . Drug dependency, combined (HCC)    benzo's and opioids  . Kidney stones   . Malingering   . Psychiatric pseudoseizure     Past Surgical History:  Procedure Laterality Date  . ABDOMINAL HYSTERECTOMY     ovaries still in.  DUB.  No malignancy.  Marland Kitchen BACK SURGERY     L spine x 2.  No hardware.  . CHOLECYSTECTOMY  2012  . INSERTION OF MESH      Family History  Problem Relation Age of Onset  . Cancer Father   . Cancer Mother   . Diabetes Mother   . Hypertension Mother   . Mental illness Mother   . Diabetes Other   . Hypertension Other     Social History   Social History  . Marital status: Widowed    Spouse name: N/A  . Number of children: N/A  . Years of education: N/A   Occupational History  . Not on file.   Social History Main Topics  . Smoking status: Current Every Day Smoker    Packs/day: 0.50    Years: 20.00    Types: Cigarettes  . Smokeless tobacco: Never Used  . Alcohol use No  . Drug use: No  . Sexual activity: Yes    Birth control/ protection: Surgical   Other Topics Concern  . Not on file   Social History Narrative   Widow since 2008.  She has 2 children.   Educ: HS   Occup: Disability for chronic low back pain and psychiatric reasons.       Outpatient Medications Prior to Visit  Medication Sig Dispense Refill  . acetaminophen (TYLENOL) 325 MG tablet Take 325 mg by mouth every 6 (six) hours as needed for mild pain, moderate pain or headache.     . albuterol (PROVENTIL HFA;VENTOLIN HFA) 108 (90 Base) MCG/ACT inhaler Inhale 2 puffs into the lungs every 4 (four) hours as needed for wheezing or shortness of  breath. 1 Inhaler 1  . albuterol (PROVENTIL) (2.5 MG/3ML) 0.083% nebulizer solution Take 2.5 mg by nebulization every 4 (four) hours as needed for wheezing or shortness of breath.    . ALPRAZolam (XANAX) 1 MG tablet Take 1 tablet (1 mg total) by mouth at bedtime as needed for anxiety. 1 tablet 0  . Fluticasone-Salmeterol (ADVAIR) 250-50 MCG/DOSE AEPB Inhale 1 puff into the lungs 2 (two) times daily. 60 each 3  . zolpidem (AMBIEN) 10 MG tablet Take 10 mg by mouth at bedtime as needed for sleep.     No facility-administered medications prior to visit.     Allergies  Allergen Reactions  . Compazine [Prochlorperazine] Hives  . Ivp Dye [Iodinated Diagnostic Agents] Swelling and Other (See Comments)    Reaction:  Facial swelling  . Ketorolac Tromethamine Swelling and Other (See Comments)    Reaction:  Facial swelling  . Morphine And Related Swelling    ROS *** PE; There were no vitals taken for this visit. *** Pertinent labs:  ***  ASSESSMENT AND PLAN:   No problem-specific Assessment & Plan notes found for this  encounter.   FOLLOW UP:  No Follow-up on file.

## 2016-09-03 ENCOUNTER — Telehealth: Payer: Self-pay | Admitting: *Deleted

## 2016-09-03 NOTE — Telephone Encounter (Signed)
FYI

## 2016-09-03 NOTE — Telephone Encounter (Signed)
Pts mother called to schedule an apt for pt. She then handed the phone over to pt to schedule the apt. I advised pt that she has been discharge from our office and we can not schedule her an apt. She then asked why she was discharged and I advised her that she was discharged from our office due to no shows and late cancellations. She voiced understanding and ended the call.

## 2017-03-09 DIAGNOSIS — F1320 Sedative, hypnotic or anxiolytic dependence, uncomplicated: Secondary | ICD-10-CM | POA: Insufficient documentation

## 2017-03-09 DIAGNOSIS — F1310 Sedative, hypnotic or anxiolytic abuse, uncomplicated: Secondary | ICD-10-CM | POA: Insufficient documentation

## 2017-06-15 DIAGNOSIS — R4689 Other symptoms and signs involving appearance and behavior: Secondary | ICD-10-CM | POA: Insufficient documentation

## 2017-07-23 ENCOUNTER — Encounter: Payer: Self-pay | Admitting: Physician Assistant

## 2017-07-23 ENCOUNTER — Ambulatory Visit (INDEPENDENT_AMBULATORY_CARE_PROVIDER_SITE_OTHER): Payer: Medicare Other | Admitting: Physician Assistant

## 2017-07-23 ENCOUNTER — Other Ambulatory Visit: Payer: Self-pay

## 2017-07-23 VITALS — BP 110/64 | HR 73 | Temp 97.6°F | Resp 16 | Ht 65.0 in | Wt 159.6 lb

## 2017-07-23 DIAGNOSIS — Z7189 Other specified counseling: Secondary | ICD-10-CM | POA: Diagnosis not present

## 2017-07-23 DIAGNOSIS — F411 Generalized anxiety disorder: Secondary | ICD-10-CM

## 2017-07-23 DIAGNOSIS — E559 Vitamin D deficiency, unspecified: Secondary | ICD-10-CM | POA: Insufficient documentation

## 2017-07-23 DIAGNOSIS — F99 Mental disorder, not otherwise specified: Secondary | ICD-10-CM | POA: Insufficient documentation

## 2017-07-23 NOTE — Patient Instructions (Addendum)
I spoke to your primary care office and discussed your controlled substance contract.  Last refill of Gabapentin was 4/5 for 90 pills - you should have plenty.  You were not prescribed Xanax by this office.  I will not fill these medications for you.   Please keep your following appointments: May 1 with Dr. Jessy Oto.  May 31 Dr. Glennon Mac  Thank you for coming in today. I hope you feel we met your needs.  Feel free to call PCP if you have any questions or further requests.  Please consider signing up for MyChart if you do not already have it, as this is a great way to communicate with me.  Best,  Whitney McVey, PA-C  IF you received an x-ray today, you will receive an invoice from [facility]. Please contact [facility] at [phone] with questions or concerns regarding your invoice.   IF you received labwork today, you will receive an invoice from [facility]. Please contact [facility] at [phone] with questions or concerns regarding your invoice.   Our billing staff will not be able to assist you with questions regarding bills from these companies.  You will be contacted with the lab results as soon as they are available. The fastest way to get your results is to activate your My Chart account. Instructions are located on the last page of this paperwork. If you have not heard from [place] regarding the results in 2 weeks, please contact this office.

## 2017-07-23 NOTE — Progress Notes (Signed)
Deanna Marshall  MRN: [id number] DOB: 10/13/1965  PCP: Patient, No Pcp Per  Subjective:  Pt is a 52 year old female with an extensive PMH including but not limited to bipolar disorder, depression, opiate dependence, panic anxiety syndrome who presents to clinic for anxiety x 2 months. Anxiety has worsened recently with her mother sick in the hospital and she needs more Xanax.   For anxiety she is taking Xanax 1mg  q hs.  Last time she saw a psychiatrist was four years ago, Dr. Jordan LikesPool. (per pt). She would also like refill of gabapentin.   Psychiatry 06/26/2017 - Roe CoombsAziz, Tarek MD - [facility]([place]). Dx c substance abuse disorder, GAD, benzodiazepine dependence, PTSD, cannabis use disorder, opoid use disorder. Rx for depakote 500mg , gabapentin 600mg  #90, 0 refill, minipress 2mg , effexor 150mg . Pt does not recall seeing psychiatrist recently, does not recall a provider by the name of Marnette Burgessziz.    Neurology 06/02/2017 - Jean RosenthalJackson, Caroline MoreMishi, MD - [facility]([place]). Rx for gabapentin (600mg ) in the morning, one (600mg ) in the afternoon and one and a half (900mg ). Pt does recall going to neurologist.  She is a current every day smoker.   Review of Systems  Constitutional: Negative for diaphoresis and fatigue.  Psychiatric/Behavioral: Negative for self-injury and suicidal ideas. The patient is nervous/anxious.     Patient Active Problem List   Diagnosis Date Noted  . Panic anxiety syndrome 03/14/2015  . Depression 03/14/2015  . Opiate dependence, continuous (HCC) 05/13/2014  . Benzodiazepine dependence, continuous (HCC) 05/13/2014  . LEG PAIN, BILATERAL 07/19/2009  . MUSCLE PAIN 03/13/2009  . VERTIGO 03/13/2009  . NAUSEA 03/13/2009  . HEADACHE 02/15/2009  . LEG CRAMPS 01/20/2009  . ANXIETY 12/09/2008  . ASTHMA 12/09/2008    Current Outpatient Medications on File Prior to Visit  Medication Sig Dispense Refill  . acetaminophen (TYLENOL) 325 MG tablet Take  325 mg by mouth every 6 (six) hours as needed for mild pain, moderate pain or headache.     . albuterol (PROVENTIL HFA;VENTOLIN HFA) 108 (90 Base) MCG/ACT inhaler Inhale 2 puffs into the lungs every 4 (four) hours as needed for wheezing or shortness of breath. 1 Inhaler 1  . Fluticasone-Salmeterol (ADVAIR) 250-50 MCG/DOSE AEPB Inhale 1 puff into the lungs 2 (two) times daily. 60 each 3  . albuterol (PROVENTIL) (2.5 MG/3ML) 0.083% nebulizer solution Take 2.5 mg by nebulization every 4 (four) hours as needed for wheezing or shortness of breath.    . ALPRAZolam (XANAX) 1 MG tablet Take 1 tablet (1 mg total) by mouth at bedtime as needed for anxiety. (Patient not taking: Reported on 07/23/2017) 1 tablet 0  . zolpidem (AMBIEN) 10 MG tablet Take 10 mg by mouth at bedtime as needed for sleep.    . [DISCONTINUED] beclomethasone (QVAR) 80 MCG/ACT inhaler Inhale 2 puffs into the lungs 2 (two) times daily. (Patient not taking: Reported on 08/14/2015) 1 Inhaler 1  . [DISCONTINUED] escitalopram (LEXAPRO) 10 MG tablet Start with 1/2 tablet for 1 week. Then increase to 1 tablet daily. (Patient not taking: Reported on 08/14/2015) 30 tablet 3  . [DISCONTINUED] PARoxetine (PAXIL) 20 MG tablet Take 1 tablet (20 mg total) by mouth daily. (Patient not taking: Reported on 03/16/2015) 30 tablet 5  . [DISCONTINUED] QUEtiapine (SEROQUEL) 100 MG tablet Take 1 tablet (100 mg total) by mouth at bedtime. (Patient not taking: Reported on 02/28/2015) 30 tablet 1   No current facility-administered medications on file prior to visit.  Allergies  Allergen Reactions  . Compazine [Prochlorperazine] Hives  . Ivp Dye [Iodinated Diagnostic Agents] Swelling and Other (See Comments)    Reaction:  Facial swelling  . Ketorolac Tromethamine Swelling and Other (See Comments)    Reaction:  Facial swelling  . Morphine And Related Swelling     Objective:  BP 110/64   Pulse 73   Temp 97.6 F (36.4 C) (Oral)   Resp 16   Ht 5\' 5"  (1.651 m)    Wt 159 lb 9.6 oz (72.4 kg)   SpO2 97%   BMI 26.56 kg/m   Physical Exam  Constitutional: She is oriented to person, place, and time. No distress.  Cardiovascular: Normal rate, regular rhythm and normal heart sounds.  Neurological: She is alert and oriented to person, place, and time.  Skin: Skin is warm and dry.  Psychiatric: Judgment normal. Her speech is slurred (mild). She is slowed. Cognition and memory are impaired.  Vitals reviewed.   Assessment and Plan :  1. Anxiety state 2. Encounter for medication counseling - Pt presents c/o worsening anxiety and request for Xanax and gabapentin.  She is a new pt to me. When asked to provide overview of her PMH, pt reports only asthma (controlled) - according to her chart she extensive medical history including bipolar disorder, depression, opiate dependence. She cannot provide names of any current medical providers and denies recent treatment.  I called [facility] in [place] where she was seen 05/23/2017. As per clerical staff, pt called this morning to make an appointment with her provider, however could not get an appt until May 1. Clerical staff states pt has a signed controlled medication contract.   I asked pt about her calling [facility] this morning, she did not recall doing this. I refuse to write medication for this pt and discussed with her importance of not breaking her contract with [facility]. Phone number provided for pt.   Marco Collie, PA-C  [facility] at [facility] 07/23/2017 2:56 PM

## 2017-09-11 DIAGNOSIS — J4530 Mild persistent asthma, uncomplicated: Secondary | ICD-10-CM | POA: Insufficient documentation

## 2017-10-16 ENCOUNTER — Ambulatory Visit (INDEPENDENT_AMBULATORY_CARE_PROVIDER_SITE_OTHER): Payer: Medicare Other

## 2017-10-16 ENCOUNTER — Other Ambulatory Visit: Payer: Self-pay | Admitting: *Deleted

## 2017-10-16 ENCOUNTER — Other Ambulatory Visit: Payer: Self-pay | Admitting: Internal Medicine

## 2017-10-16 ENCOUNTER — Other Ambulatory Visit: Payer: Self-pay | Admitting: Family Medicine

## 2017-10-16 DIAGNOSIS — Z1231 Encounter for screening mammogram for malignant neoplasm of breast: Secondary | ICD-10-CM

## 2017-10-16 DIAGNOSIS — K5900 Constipation, unspecified: Secondary | ICD-10-CM

## 2017-10-16 DIAGNOSIS — Z87442 Personal history of urinary calculi: Secondary | ICD-10-CM

## 2017-11-22 DIAGNOSIS — F1220 Cannabis dependence, uncomplicated: Secondary | ICD-10-CM | POA: Insufficient documentation

## 2018-02-12 LAB — HM COLONOSCOPY

## 2018-03-13 DIAGNOSIS — F10239 Alcohol dependence with withdrawal, unspecified: Secondary | ICD-10-CM | POA: Insufficient documentation

## 2018-03-13 DIAGNOSIS — F10939 Alcohol use, unspecified with withdrawal, unspecified: Secondary | ICD-10-CM | POA: Insufficient documentation

## 2019-08-26 DIAGNOSIS — H25811 Combined forms of age-related cataract, right eye: Secondary | ICD-10-CM | POA: Insufficient documentation

## 2020-02-09 ENCOUNTER — Inpatient Hospital Stay (HOSPITAL_COMMUNITY)
Admission: EM | Admit: 2020-02-09 | Discharge: 2020-02-09 | DRG: 101 | Discharge status: Against Medical Advice | Payer: Medicare HMO | Attending: Internal Medicine | Admitting: Internal Medicine

## 2020-02-09 ENCOUNTER — Emergency Department (HOSPITAL_COMMUNITY): Payer: Medicare HMO

## 2020-02-09 ENCOUNTER — Encounter (HOSPITAL_COMMUNITY): Payer: Self-pay

## 2020-02-09 ENCOUNTER — Inpatient Hospital Stay (HOSPITAL_COMMUNITY): Payer: Medicare HMO

## 2020-02-09 DIAGNOSIS — F319 Bipolar disorder, unspecified: Secondary | ICD-10-CM | POA: Diagnosis present

## 2020-02-09 DIAGNOSIS — F4310 Post-traumatic stress disorder, unspecified: Secondary | ICD-10-CM | POA: Diagnosis present

## 2020-02-09 DIAGNOSIS — G40901 Epilepsy, unspecified, not intractable, with status epilepticus: Secondary | ICD-10-CM | POA: Diagnosis present

## 2020-02-09 DIAGNOSIS — M549 Dorsalgia, unspecified: Secondary | ICD-10-CM | POA: Diagnosis present

## 2020-02-09 DIAGNOSIS — Z79890 Hormone replacement therapy (postmenopausal): Secondary | ICD-10-CM

## 2020-02-09 DIAGNOSIS — Z79899 Other long term (current) drug therapy: Secondary | ICD-10-CM

## 2020-02-09 DIAGNOSIS — R278 Other lack of coordination: Secondary | ICD-10-CM | POA: Diagnosis present

## 2020-02-09 DIAGNOSIS — F419 Anxiety disorder, unspecified: Secondary | ICD-10-CM | POA: Diagnosis present

## 2020-02-09 DIAGNOSIS — S0003XA Contusion of scalp, initial encounter: Secondary | ICD-10-CM | POA: Diagnosis present

## 2020-02-09 DIAGNOSIS — Z818 Family history of other mental and behavioral disorders: Secondary | ICD-10-CM

## 2020-02-09 DIAGNOSIS — J449 Chronic obstructive pulmonary disease, unspecified: Secondary | ICD-10-CM | POA: Diagnosis present

## 2020-02-09 DIAGNOSIS — F1110 Opioid abuse, uncomplicated: Secondary | ICD-10-CM | POA: Diagnosis present

## 2020-02-09 DIAGNOSIS — Z888 Allergy status to other drugs, medicaments and biological substances status: Secondary | ICD-10-CM

## 2020-02-09 DIAGNOSIS — F1320 Sedative, hypnotic or anxiolytic dependence, uncomplicated: Secondary | ICD-10-CM | POA: Diagnosis present

## 2020-02-09 DIAGNOSIS — Z833 Family history of diabetes mellitus: Secondary | ICD-10-CM

## 2020-02-09 DIAGNOSIS — Z20822 Contact with and (suspected) exposure to covid-19: Secondary | ICD-10-CM | POA: Diagnosis present

## 2020-02-09 DIAGNOSIS — Z8249 Family history of ischemic heart disease and other diseases of the circulatory system: Secondary | ICD-10-CM | POA: Diagnosis not present

## 2020-02-09 DIAGNOSIS — F17210 Nicotine dependence, cigarettes, uncomplicated: Secondary | ICD-10-CM | POA: Diagnosis present

## 2020-02-09 DIAGNOSIS — Z87442 Personal history of urinary calculi: Secondary | ICD-10-CM | POA: Diagnosis not present

## 2020-02-09 DIAGNOSIS — Z885 Allergy status to narcotic agent status: Secondary | ICD-10-CM

## 2020-02-09 DIAGNOSIS — R569 Unspecified convulsions: Secondary | ICD-10-CM

## 2020-02-09 DIAGNOSIS — G8929 Other chronic pain: Secondary | ICD-10-CM | POA: Diagnosis present

## 2020-02-09 DIAGNOSIS — S0081XA Abrasion of other part of head, initial encounter: Secondary | ICD-10-CM | POA: Diagnosis present

## 2020-02-09 DIAGNOSIS — Z765 Malingerer [conscious simulation]: Secondary | ICD-10-CM

## 2020-02-09 DIAGNOSIS — R55 Syncope and collapse: Secondary | ICD-10-CM | POA: Diagnosis present

## 2020-02-09 DIAGNOSIS — Z7951 Long term (current) use of inhaled steroids: Secondary | ICD-10-CM | POA: Diagnosis not present

## 2020-02-09 LAB — URINALYSIS, ROUTINE W REFLEX MICROSCOPIC
Bilirubin Urine: NEGATIVE
Glucose, UA: 50 mg/dL — AB
Ketones, ur: 20 mg/dL — AB
Nitrite: NEGATIVE
Protein, ur: 30 mg/dL — AB
Specific Gravity, Urine: 1.028 (ref 1.005–1.030)
pH: 5 (ref 5.0–8.0)

## 2020-02-09 LAB — COMPREHENSIVE METABOLIC PANEL
ALT: 22 U/L (ref 0–44)
AST: 46 U/L — ABNORMAL HIGH (ref 15–41)
Albumin: 4.3 g/dL (ref 3.5–5.0)
Alkaline Phosphatase: 70 U/L (ref 38–126)
Anion gap: 15 (ref 5–15)
BUN: 18 mg/dL (ref 6–20)
CO2: 17 mmol/L — ABNORMAL LOW (ref 22–32)
Calcium: 9.8 mg/dL (ref 8.9–10.3)
Chloride: 104 mmol/L (ref 98–111)
Creatinine, Ser: 0.98 mg/dL (ref 0.44–1.00)
GFR, Estimated: >60 mL/min (ref >60)
Glucose, Bld: 183 mg/dL — ABNORMAL HIGH (ref 70–99)
Potassium: 5 mmol/L (ref 3.5–5.1)
Sodium: 136 mmol/L (ref 135–145)
Total Bilirubin: 2.4 mg/dL — ABNORMAL HIGH (ref 0.3–1.2)
Total Protein: 6.8 g/dL (ref 6.5–8.1)

## 2020-02-09 LAB — CBC WITH DIFFERENTIAL/PLATELET
Abs Immature Granulocytes: 0.09 10*3/uL — ABNORMAL HIGH (ref 0.00–0.07)
Basophils Absolute: 0.1 10*3/uL (ref 0.0–0.1)
Basophils Relative: 1 %
Eosinophils Absolute: 0.1 10*3/uL (ref 0.0–0.5)
Eosinophils Relative: 1 %
HCT: 51.6 % — ABNORMAL HIGH (ref 36.0–46.0)
Hemoglobin: 17.3 g/dL — ABNORMAL HIGH (ref 12.0–15.0)
Immature Granulocytes: 1 %
Lymphocytes Relative: 21 %
Lymphs Abs: 2.6 10*3/uL (ref 0.7–4.0)
MCH: 33.1 pg (ref 26.0–34.0)
MCHC: 33.5 g/dL (ref 30.0–36.0)
MCV: 98.9 fL (ref 80.0–100.0)
Monocytes Absolute: 0.7 10*3/uL (ref 0.1–1.0)
Monocytes Relative: 5 %
Neutro Abs: 8.9 10*3/uL — ABNORMAL HIGH (ref 1.7–7.7)
Neutrophils Relative %: 71 %
Platelets: 199 10*3/uL (ref 150–400)
RBC: 5.22 MIL/uL — ABNORMAL HIGH (ref 3.87–5.11)
RDW: 12.3 % (ref 11.5–15.5)
WBC: 12.4 10*3/uL — ABNORMAL HIGH (ref 4.0–10.5)
nRBC: 0 % (ref 0.0–0.2)

## 2020-02-09 LAB — MAGNESIUM: Magnesium: 2 mg/dL (ref 1.7–2.4)

## 2020-02-09 LAB — RAPID URINE DRUG SCREEN, HOSP PERFORMED
Amphetamines: NOT DETECTED
Barbiturates: NOT DETECTED
Benzodiazepines: NOT DETECTED
Cocaine: NOT DETECTED
Opiates: NOT DETECTED
Tetrahydrocannabinol: NOT DETECTED

## 2020-02-09 LAB — RESPIRATORY PANEL BY RT PCR (FLU A&B, COVID)
Influenza A by PCR: NEGATIVE
Influenza B by PCR: NEGATIVE
SARS Coronavirus 2 by RT PCR: NEGATIVE

## 2020-02-09 LAB — CBG MONITORING, ED: Glucose-Capillary: 193 mg/dL — ABNORMAL HIGH (ref 70–99)

## 2020-02-09 LAB — TROPONIN I (HIGH SENSITIVITY)
Troponin I (High Sensitivity): 4 ng/L (ref <18)
Troponin I (High Sensitivity): 5 ng/L (ref <18)

## 2020-02-09 LAB — ETHANOL: Alcohol, Ethyl (B): <10 mg/dL (ref <10)

## 2020-02-09 MED ORDER — LEVETIRACETAM IN NACL 1000 MG/100ML IV SOLN: 1000.0000 mg | Freq: Two times a day (BID) | INTRAVENOUS | Status: DC

## 2020-02-09 MED ORDER — THIAMINE HCL 100 MG PO TABS: 100.0000 mg | ORAL_TABLET | Freq: Every day | ORAL | Status: DC

## 2020-02-09 MED ORDER — GABAPENTIN 600 MG PO TABS: 600.0000 mg | ORAL_TABLET | Freq: Three times a day (TID) | ORAL | Status: DC

## 2020-02-09 MED ORDER — ACETAMINOPHEN 650 MG RE SUPP: 650.0000 mg | Freq: Four times a day (QID) | RECTAL | Status: DC | PRN

## 2020-02-09 MED ORDER — LORAZEPAM 2 MG/ML IJ SOLN: 0.0000 mg | Freq: Two times a day (BID) | INTRAMUSCULAR | Status: DC

## 2020-02-09 MED ORDER — LORAZEPAM 1 MG PO TABS: 0.0000 mg | ORAL_TABLET | Freq: Two times a day (BID) | ORAL | Status: DC

## 2020-02-09 MED ORDER — LORAZEPAM 2 MG/ML IJ SOLN: 2.0000 mg | Freq: Once | INTRAMUSCULAR | Status: DC | PRN

## 2020-02-09 MED ORDER — LORAZEPAM 1 MG PO TABS: 0.0000 mg | ORAL_TABLET | Freq: Four times a day (QID) | ORAL | Status: DC

## 2020-02-09 MED ORDER — ESTRADIOL 1 MG PO TABS: 1.0000 mg | ORAL_TABLET | Freq: Every day | ORAL | Status: DC

## 2020-02-09 MED ORDER — ACETAMINOPHEN 325 MG PO TABS: 325.0000 mg | ORAL_TABLET | Freq: Four times a day (QID) | ORAL | Status: DC | PRN

## 2020-02-09 MED ORDER — ALBUTEROL SULFATE HFA 108 (90 BASE) MCG/ACT IN AERS: 2.0000 | INHALATION_SPRAY | RESPIRATORY_TRACT | Status: DC | PRN

## 2020-02-09 MED ORDER — LORAZEPAM 2 MG/ML IJ SOLN
2.0000 mg | Freq: Once | INTRAMUSCULAR | Status: AC
  Administered 2020-02-09: 2 mg via INTRAMUSCULAR

## 2020-02-09 MED ORDER — ONDANSETRON HCL 4 MG PO TABS: 4.0000 mg | ORAL_TABLET | Freq: Four times a day (QID) | ORAL | Status: DC | PRN

## 2020-02-09 MED ORDER — ZOLPIDEM TARTRATE 5 MG PO TABS: 5.0000 mg | ORAL_TABLET | Freq: Every day | ORAL | Status: DC

## 2020-02-09 MED ORDER — LORAZEPAM 2 MG/ML IJ SOLN: 0.0000 mg | Freq: Four times a day (QID) | INTRAMUSCULAR | Status: DC

## 2020-02-09 MED ORDER — LORAZEPAM 2 MG/ML IJ SOLN
1.0000 mg | Freq: Once | INTRAMUSCULAR | Status: DC
  Filled 2020-02-09: qty 1

## 2020-02-09 MED ORDER — FLUTICASONE FUROATE-VILANTEROL 200-25 MCG/INH IN AEPB: 1.0000 | INHALATION_SPRAY | Freq: Every day | RESPIRATORY_TRACT | Status: DC

## 2020-02-09 MED ORDER — ONDANSETRON HCL 4 MG/2ML IJ SOLN
4.0000 mg | Freq: Once | INTRAMUSCULAR | Status: AC
  Administered 2020-02-09: 4 mg via INTRAVENOUS
  Filled 2020-02-09: qty 2

## 2020-02-09 MED ORDER — SODIUM CHLORIDE 0.9 % IV SOLN
3800.0000 mg | Freq: Once | INTRAVENOUS | Status: DC
  Filled 2020-02-09: qty 38

## 2020-02-09 MED ORDER — ONDANSETRON HCL 4 MG/2ML IJ SOLN: 4.0000 mg | Freq: Four times a day (QID) | INTRAMUSCULAR | Status: DC | PRN

## 2020-02-09 MED ORDER — SODIUM CHLORIDE 0.9 % IV BOLUS
1000.0000 mL | Freq: Once | INTRAVENOUS | Status: AC
  Administered 2020-02-09: 1000 mL via INTRAVENOUS

## 2020-02-09 MED ORDER — DIVALPROEX SODIUM 250 MG PO DR TAB: 250.0000 mg | DELAYED_RELEASE_TABLET | Freq: Two times a day (BID) | ORAL | Status: DC

## 2020-02-09 MED ORDER — LORAZEPAM 2 MG/ML IJ SOLN: 1.0000 mg | Freq: Once | INTRAMUSCULAR | Status: DC

## 2020-02-09 MED ORDER — VENLAFAXINE HCL ER 150 MG PO CP24: 150.0000 mg | ORAL_CAPSULE | Freq: Every day | ORAL | Status: DC

## 2020-02-09 MED ORDER — VITAMIN D3 25 MCG (1000 UNIT) PO TABS: 1000.0000 [IU] | ORAL_TABLET | Freq: Every day | ORAL | Status: DC

## 2020-02-09 MED ORDER — ENOXAPARIN SODIUM 40 MG/0.4ML ~~LOC~~ SOLN: 40.0000 mg | SUBCUTANEOUS | Status: DC

## 2020-02-09 MED ORDER — LORAZEPAM 1 MG PO TABS: 1.0000 mg | ORAL_TABLET | Freq: Once | ORAL | Status: DC

## 2020-02-09 MED ORDER — PRAZOSIN HCL 1 MG PO CAPS: 1.0000 mg | ORAL_CAPSULE | Freq: Every day | ORAL | Status: DC

## 2020-02-09 MED ORDER — ACETAMINOPHEN 325 MG PO TABS: 650.0000 mg | ORAL_TABLET | Freq: Four times a day (QID) | ORAL | Status: DC | PRN

## 2020-02-09 MED ORDER — QUETIAPINE FUMARATE 50 MG PO TABS: 50.0000 mg | ORAL_TABLET | Freq: Every day | ORAL | Status: DC

## 2020-02-09 MED ORDER — ALBUTEROL SULFATE HFA 108 (90 BASE) MCG/ACT IN AERS: 1.0000 | INHALATION_SPRAY | Freq: Four times a day (QID) | RESPIRATORY_TRACT | Status: DC | PRN

## 2020-02-09 MED ORDER — THIAMINE HCL 100 MG/ML IJ SOLN
100.0000 mg | Freq: Every day | INTRAMUSCULAR | Status: DC
  Filled 2020-02-09: qty 2

## 2020-02-09 MED ORDER — TRAMADOL HCL 50 MG PO TABS: 50.0000 mg | ORAL_TABLET | Freq: Two times a day (BID) | ORAL | Status: DC | PRN

## 2020-02-09 NOTE — ED Notes (Signed)
Patient transported to CT 

## 2020-02-09 NOTE — ED Provider Notes (Signed)
[facility] EMERGENCY DEPARTMENT Provider Note   CSN: [id number] Arrival date & time: 02/09/20  1321     History Chief Complaint  Deanna Marshall presents with  . Loss of Consciousness    Deanna Marshall is a 54 y.o. female.  Deanna Marshall arrives after syncopal event/seizure-like activity.  Deanna Marshall states that Deanna Marshall does drink alcohol fairly regularly but the last drink was about 2 days ago.  States that Deanna Marshall took an Ativan last night.  States that Deanna Marshall was at a restaurant then sat on the bench after eating and Deanna Marshall knew Deanna Marshall woke up on the floor.  Deanna Marshall feels nauseous and Deanna Marshall is sweaty.  Does not feel like Deanna Marshall is back to her baseline or normal self.  No history of seizures.  Denies any chest pain or shortness of breath.  Deanna Marshall has abrasions to her face and hands.  Deanna Marshall is not on any blood thinners.  Deanna Marshall thinks that Deanna Marshall might have bit her tongue as well.  The history is provided by the Deanna Marshall.  Loss of Consciousness Episode history:  Single Most recent episode:  Today Progression:  Improving Chronicity:  New Relieved by:  Nothing Worsened by:  Nothing Associated symptoms: diaphoresis and nausea   Associated symptoms: no anxiety, no chest pain, no difficulty breathing, no dizziness, no fever, no headaches, no palpitations, no seizures, no shortness of breath, no vomiting and no weakness   Risk factors: no seizures        Past Medical History:  Diagnosis Date  . Anxiety and depression   . Asthma    onset age 48 per pt  . Chronic back pain   . COPD (chronic obstructive pulmonary disease) (HCC)   . Drug dependency, combined (HCC)    benzo's and opioids  . Kidney stones   . Malingering   . Psychiatric pseudoseizure     Deanna Marshall Active Problem List   Diagnosis Date Noted  . Mental disorder 07/23/2017  . Vitamin D deficiency 07/23/2017  . Abnormal behavior 06/15/2017  . Benzodiazepine dependence (HCC) 03/09/2017  . 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 Non compliance w medication regimen  08/17/2015  . Opioid overdose (HCC) 05/28/2015  . Panic anxiety syndrome 03/14/2015  . Depression 03/14/2015  . Frequent headaches 01/11/2015  . History of narcotic use 01/11/2015  . Insomnia 01/11/2015  . Opiate dependence, continuous (HCC) 05/13/2014  . Benzodiazepine dependence, continuous (HCC) 05/13/2014  . Combinations of drug dependence excluding opioid type drug, abuse (HCC) 07/27/2013  . Hypercholesterolemia 07/27/2013  . Pelvic pain 04/06/2013  . Chronic obstructive pulmonary disease (HCC) 04/05/2013  . Chronic pain syndrome 08/06/2012  . Degeneration of lumbosacral intervertebral disc 08/06/2012  . Injury of spinal nerve root at L5 level 06/02/2012  . Bipolar I disorder, most recent episode depressed, severe without psychotic features (HCC) 10/24/2011  . History of respiratory failure 09/15/2011  . Female stress incontinence 04/15/2011  . Prolapse of anterior vaginal wall 02/07/2011  . History of gastroesophageal reflux (GERD) 02/04/2011  . Tobacco user 02/04/2011  . Chronic radicular low back pain 08/08/2010  . Restless legs syndrome 01/11/2010  . LEG PAIN, BILATERAL 07/19/2009  . MUSCLE PAIN 03/13/2009  . VERTIGO 03/13/2009  . NAUSEA 03/13/2009  . HEADACHE 02/15/2009  . LEG CRAMPS 01/20/2009  . Anxiety state 12/09/2008  . ASTHMA 12/09/2008    Past Surgical History:  Procedure Laterality Date  . ABDOMINAL HYSTERECTOMY     ovaries still in.  DUB.  No malignancy.  Marland Kitchen BACK SURGERY  L spine x 2.  No hardware.  . CHOLECYSTECTOMY  2012  . INSERTION OF MESH       OB History   No obstetric history on file.     Family History  Problem Relation Age of Onset  . Cancer Father   . Cancer Mother   . Diabetes Mother   . Hypertension Mother   . Mental illness Mother   . Diabetes Other   . Hypertension Other     Social History   Tobacco Use  . Smoking status: Current Every Day Smoker    Packs/day: 0.50    Years: 20.00    Pack years: 10.00    Types:  Cigarettes  . Smokeless tobacco: Never Used  Substance Use Topics  . Alcohol use: No    Alcohol/week: 0.0 standard drinks  . Drug use: Not Currently    Home Medications Prior to Admission medications   Medication Sig Start Date End Date Taking? Authorizing Provider  acetaminophen (TYLENOL) 325 MG tablet Take 325 mg by mouth every 6 (six) hours as needed for mild pain, moderate pain or headache.     [provider]  albuterol (PROVENTIL HFA;VENTOLIN HFA) 108 (90 Base) MCG/ACT inhaler Inhale 2 puffs into the lungs every 4 (four) hours as needed for wheezing or shortness of breath. 08/14/15   Joy, Shawn C, PA-C  albuterol (PROVENTIL HFA;VENTOLIN HFA) 108 (90 Base) MCG/ACT inhaler Inhale into the lungs. 08/17/15   [provider]  cholecalciferol (VITAMIN D) 1000 units tablet Take by mouth. 06/16/17   [provider]  divalproex (DEPAKOTE) 500 MG DR tablet Take by mouth. 06/26/17 07/26/17  [provider]  divalproex (DEPAKOTE) 500 MG DR tablet Take one tablet (500 mg dose) by mouth at bedtime. 06/26/17   [provider]  estradiol (ESTRACE) 1 MG tablet Take by mouth. 02/13/17   [provider]  estradiol (ESTRACE) 1 MG tablet Take 1 mg by mouth daily. 06/20/17   [provider]  eszopiclone (LUNESTA) 1 MG TABS tablet Take by mouth.    [provider]  Eszopiclone 3 MG TABS Take 1 at night 03/09/17   [provider]  etodolac (LODINE) 400 MG tablet Take by mouth. 04/11/17   [provider]  Fluticasone-Salmeterol (ADVAIR) 250-50 MCG/DOSE AEPB Inhale 1 puff into the lungs 2 (two) times daily. 03/25/16   McGowen, Maryjean Morn, MD  gabapentin (NEURONTIN) 600 MG tablet Take one tablet (600 mg dose) by mouth 3 (three) times a day. 07/11/17   [provider]  LYRICA 50 MG capsule Take one capsule (50 mg total) by mouth 3 (three) times a day. 04/15/17   [provider]  nicotine (NICODERM CQ - DOSED IN MG/24 HOURS)  21 mg/24hr patch Place onto the skin. 06/16/17   [provider]  prazosin (MINIPRESS) 2 MG capsule Take by mouth. 06/26/17 07/26/17  [provider]  prazosin (MINIPRESS) 2 MG capsule TK ONE C PO HS 06/17/17   [provider]  QUEtiapine (SEROQUEL) 50 MG tablet Take 50 mg by mouth at bedtime. 06/09/17   [provider]  traMADol (ULTRAM) 50 MG tablet Take 50 mg by mouth every 8 (eight) hours as needed. for pain 06/18/17   [provider]  venlafaxine XR (EFFEXOR-XR) 150 MG 24 hr capsule Take by mouth. 06/26/17 07/26/17  [provider]  zolpidem (AMBIEN) 10 MG tablet Take 10 mg by mouth at bedtime as needed for sleep.    [provider]  beclomethasone (QVAR) 80 MCG/ACT inhaler Inhale 2 puffs into the lungs 2 (two) times daily. Deanna Marshall not taking: Reported on 08/14/2015 08/06/15 08/14/15  Wallis Bamberg, PA-C  escitalopram (LEXAPRO) 10 MG tablet Start with 1/2 tablet for 1 week. Then increase to 1 tablet daily. Deanna Marshall not taking: Reported on 08/14/2015 08/06/15 08/14/15  Wallis Bamberg, PA-C  PARoxetine (PAXIL) 20 MG tablet Take 1 tablet (20 mg total) by mouth daily. Deanna Marshall not taking: Reported on 03/16/2015 02/28/15 03/16/15  Carmelina Dane, MD    Allergies    Compazine [prochlorperazine], Ivp dye [iodinated diagnostic agents], Ketorolac tromethamine, and Morphine and related  Review of Systems   Review of Systems  Constitutional: Positive for diaphoresis. Negative for chills and fever.  HENT: Negative for ear pain and sore throat.   Eyes: Negative for pain and visual disturbance.  Respiratory: Negative for cough and shortness of breath.   Cardiovascular: Positive for syncope. Negative for chest pain and palpitations.  Gastrointestinal: Positive for nausea. Negative for abdominal pain and vomiting.  Genitourinary: Negative for dysuria and hematuria.  Musculoskeletal: Negative for arthralgias and back pain.  Skin: Positive for wound. Negative  for color change and rash.  Neurological: Positive for syncope. Negative for dizziness, tremors, seizures, facial asymmetry, speech difficulty, weakness, light-headedness, numbness and headaches.  All other systems reviewed and are negative.   Physical Exam Updated Vital Signs  ED Triage Vitals  Enc Vitals Group     BP 02/09/20 1330 (!) 109/55     Pulse Rate 02/09/20 1330 84     Resp 02/09/20 1330 20     Temp 02/09/20 1330 97.6 F (36.4 C)     Temp Source 02/09/20 1330 Oral     SpO2 02/09/20 1330 97 %     Weight 02/09/20 1339 140 lb (63.5 kg)     Height 02/09/20 1339  (1.651 m)     Head Circumference --      Peak Flow --      Pain Score 02/09/20 1339 0     Pain Loc --      Pain Edu? --      Excl. in GC? --     Physical Exam Vitals and nursing note reviewed.  Constitutional:      General: Deanna Marshall is not in acute distress.    Appearance: Deanna Marshall is well-developed. Deanna Marshall is ill-appearing and diaphoretic.  HENT:     Head: Normocephalic and atraumatic.     Nose: Nose normal.     Mouth/Throat:     Mouth: Mucous membranes are moist.  Eyes:     Extraocular Movements: Extraocular movements intact.     Conjunctiva/sclera: Conjunctivae normal.     Pupils: Pupils are equal, round, and reactive to light.     Comments: Pupils are 6 mm bilaterally and reactive  Cardiovascular:     Rate and Rhythm: Normal rate and regular rhythm.     Pulses: Normal pulses.     Heart sounds: Normal heart sounds. No murmur heard.   Pulmonary:     Effort: Pulmonary effort is normal. No respiratory distress.     Breath sounds: Normal breath sounds.  Abdominal:     Palpations: Abdomen is soft.     Tenderness: There is no abdominal tenderness.  Musculoskeletal:     Cervical back: Normal range of motion and neck supple.  Skin:    General: Skin is warm.     Capillary Refill: Capillary refill takes less than 2 seconds.     Comments:  Abrasions to forehead, face, hands  Neurological:     General: No focal  deficit present.     Mental Status: Deanna Marshall is alert and oriented to person, place, and time.     Cranial Nerves: No cranial nerve deficit.     Sensory: No sensory deficit.     Motor: No weakness.     Comments: Somnolent but able to participate in exam, appears to have 5+ out of 5 strength all, normal sensation  Psychiatric:        Mood and Affect: Mood normal.     ED Results / Procedures / Treatments   Labs (all labs ordered are listed, but only abnormal results are displayed) Labs Reviewed  CBC WITH DIFFERENTIAL/PLATELET - Abnormal; Notable for the following components:      Result Value   WBC 12.4 (*)    RBC 5.22 (*)    Hemoglobin 17.3 (*)    HCT 51.6 (*)    Neutro Abs 8.9 (*)    Abs Immature Granulocytes 0.09 (*)    All other components within normal limits  COMPREHENSIVE METABOLIC PANEL - Abnormal; Notable for the following components:   CO2 17 (*)    Glucose, Bld 183 (*)    AST 46 (*)    Total Bilirubin 2.4 (*)    All other components within normal limits  CBG MONITORING, ED - Abnormal; Notable for the following components:   Glucose-Capillary 193 (*)    All other components within normal limits  RESPIRATORY PANEL BY RT PCR (FLU A&B, COVID)  ETHANOL  MAGNESIUM  RAPID URINE DRUG SCREEN, HOSP PERFORMED  URINALYSIS, ROUTINE W REFLEX MICROSCOPIC  AMMONIA  TROPONIN I (HIGH SENSITIVITY)  TROPONIN I (HIGH SENSITIVITY)    EKG EKG Interpretation  Date/Time:  Wednesday February 09 2020 13:31:23 EDT Ventricular Rate:  88 PR Interval:    QRS Duration: 98 QT Interval:  467 QTC Calculation: 566 R Axis:   14 Text Interpretation: Sinus rhythm Borderline T abnormalities, anterior leads Prolonged QT interval Baseline wander in lead(s) II III aVF V6 Confirmed by Virgina NorfolkAdam, Zakiyyah Savannah [phone]([id number]) on 02/09/2020 1:57:14 PM   Radiology CT Head Wo Contrast  Result Date: 02/09/2020 CLINICAL DATA:  Seizure, facial injury. EXAM: CT HEAD WITHOUT CONTRAST CT MAXILLOFACIAL WITHOUT CONTRAST CT  CERVICAL SPINE WITHOUT CONTRAST TECHNIQUE: Multidetector CT imaging of the head, cervical spine, and maxillofacial structures were performed using the standard protocol without intravenous contrast. Multiplanar CT image reconstructions of the cervical spine and maxillofacial structures were also generated. COMPARISON:  None. FINDINGS: CT HEAD FINDINGS Brain: No evidence of acute infarction, hemorrhage, hydrocephalus, extra-axial collection or mass lesion/mass effect. Vascular: No hyperdense vessel or unexpected calcification. Skull: Normal. Negative for fracture or focal lesion. Other: Small left frontal scalp hematoma is noted. CT MAXILLOFACIAL FINDINGS Osseous: No fracture or mandibular dislocation. No destructive process. Orbits: Negative. No traumatic or inflammatory finding. Sinuses: Clear. Soft tissues: Negative. CT CERVICAL SPINE FINDINGS Alignment: Normal. Skull base and vertebrae: No acute fracture. No primary bone lesion or focal pathologic process. Soft tissues and spinal canal: No prevertebral fluid or swelling. No visible canal hematoma. Disc levels: Mild degenerative disc disease is noted at C4-5, C5-6 and C6-7. Upper chest: Negative. Other: None. IMPRESSION: 1. Small left frontal scalp hematoma. No acute intracranial abnormality seen. 2. No abnormality seen in maxillofacial region. 3. Mild multilevel degenerative disc disease. No acute abnormality seen in the cervical spine. Electronically Signed   By: Lupita RaiderJames  Green Jr M.D.   On: 02/09/2020 15:10  CT Cervical Spine Wo Contrast  Result Date: 02/09/2020 CLINICAL DATA:  Seizure, facial injury. EXAM: CT HEAD WITHOUT CONTRAST CT MAXILLOFACIAL WITHOUT CONTRAST CT CERVICAL SPINE WITHOUT CONTRAST TECHNIQUE: Multidetector CT imaging of the head, cervical spine, and maxillofacial structures were performed using the standard protocol without intravenous contrast. Multiplanar CT image reconstructions of the cervical spine and maxillofacial structures were  also generated. COMPARISON:  None. FINDINGS: CT HEAD FINDINGS Brain: No evidence of acute infarction, hemorrhage, hydrocephalus, extra-axial collection or mass lesion/mass effect. Vascular: No hyperdense vessel or unexpected calcification. Skull: Normal. Negative for fracture or focal lesion. Other: Small left frontal scalp hematoma is noted. CT MAXILLOFACIAL FINDINGS Osseous: No fracture or mandibular dislocation. No destructive process. Orbits: Negative. No traumatic or inflammatory finding. Sinuses: Clear. Soft tissues: Negative. CT CERVICAL SPINE FINDINGS Alignment: Normal. Skull base and vertebrae: No acute fracture. No primary bone lesion or focal pathologic process. Soft tissues and spinal canal: No prevertebral fluid or swelling. No visible canal hematoma. Disc levels: Mild degenerative disc disease is noted at C4-5, C5-6 and C6-7. Upper chest: Negative. Other: None. IMPRESSION: 1. Small left frontal scalp hematoma. No acute intracranial abnormality seen. 2. No abnormality seen in maxillofacial region. 3. Mild multilevel degenerative disc disease. No acute abnormality seen in the cervical spine. Electronically Signed   By: Lupita Raider M.D.   On: 02/09/2020 15:10   CT Maxillofacial Wo Contrast  Result Date: 02/09/2020 CLINICAL DATA:  Seizure, facial injury. EXAM: CT HEAD WITHOUT CONTRAST CT MAXILLOFACIAL WITHOUT CONTRAST CT CERVICAL SPINE WITHOUT CONTRAST TECHNIQUE: Multidetector CT imaging of the head, cervical spine, and maxillofacial structures were performed using the standard protocol without intravenous contrast. Multiplanar CT image reconstructions of the cervical spine and maxillofacial structures were also generated. COMPARISON:  None. FINDINGS: CT HEAD FINDINGS Brain: No evidence of acute infarction, hemorrhage, hydrocephalus, extra-axial collection or mass lesion/mass effect. Vascular: No hyperdense vessel or unexpected calcification. Skull: Normal. Negative for fracture or focal lesion.  Other: Small left frontal scalp hematoma is noted. CT MAXILLOFACIAL FINDINGS Osseous: No fracture or mandibular dislocation. No destructive process. Orbits: Negative. No traumatic or inflammatory finding. Sinuses: Clear. Soft tissues: Negative. CT CERVICAL SPINE FINDINGS Alignment: Normal. Skull base and vertebrae: No acute fracture. No primary bone lesion or focal pathologic process. Soft tissues and spinal canal: No prevertebral fluid or swelling. No visible canal hematoma. Disc levels: Mild degenerative disc disease is noted at C4-5, C5-6 and C6-7. Upper chest: Negative. Other: None. IMPRESSION: 1. Small left frontal scalp hematoma. No acute intracranial abnormality seen. 2. No abnormality seen in maxillofacial region. 3. Mild multilevel degenerative disc disease. No acute abnormality seen in the cervical spine. Electronically Signed   By: Lupita Raider M.D.   On: 02/09/2020 15:10    Procedures .Critical Care Performed by: Virgina Norfolk, DO Authorized by: Virgina Norfolk, DO   Critical care provider statement:    Critical care time (minutes):  35   Critical care was necessary to treat or prevent imminent or life-threatening deterioration of the following conditions:  CNS failure or compromise   Critical care was time spent personally by me on the following activities:  Blood draw for specimens, development of treatment plan with Deanna Marshall or surrogate, discussions with consultants, discussions with primary provider, evaluation of Deanna Marshall's response to treatment, examination of Deanna Marshall, obtaining history from Deanna Marshall or surrogate, ordering and performing treatments and interventions, ordering and review of laboratory studies, ordering and review of radiographic studies, pulse oximetry, re-evaluation of Deanna Marshall's condition and review of old  charts   (including critical care time)  Medications Ordered in ED Medications  levETIRAcetam (KEPPRA) 3,800 mg in sodium chloride 0.9 % 250 mL IVPB (has no  administration in time range)  LORazepam (ATIVAN) injection 0-4 mg (has no administration in time range)    Or  LORazepam (ATIVAN) tablet 0-4 mg (has no administration in time range)  LORazepam (ATIVAN) injection 0-4 mg (has no administration in time range)    Or  LORazepam (ATIVAN) tablet 0-4 mg (has no administration in time range)  thiamine tablet 100 mg (has no administration in time range)    Or  thiamine (B-1) injection 100 mg (has no administration in time range)  sodium chloride 0.9 % bolus 1,000 mL (1,000 mLs Intravenous New Bag/Given 02/09/20 1426)  ondansetron (ZOFRAN) injection 4 mg (4 mg Intravenous Given 02/09/20 1425)  LORazepam (ATIVAN) injection 2 mg (2 mg Intramuscular Given 02/09/20 1418)    ED Course  I have reviewed the triage vital signs and the nursing notes.  Pertinent labs & imaging results that were available during my care of the Deanna Marshall were reviewed by me and considered in my medical decision making (see chart for details).    MDM Rules/Calculators/A&P                          Mckynlee Hayashi is a 54 year old female with history of asthma, chronic pain, COPD, anxiety and depression, polysubstance abuse, possibly pseudoseizures who presents to the ED after seizure-like episode. Deanna Marshall with unremarkable vitals. No fever. EKG shows sinus rhythm but slightly prolonged QTC at 566. Deanna Marshall states that Deanna Marshall was eating at a restaurant and went out to sit on a bench Deanna Marshall knew Deanna Marshall was on the floor. Deanna Marshall has abrasions to her forehead, philtrum area. Deanna Marshall did appear to bite her tongue. Deanna Marshall appears postictal and Deanna Marshall is diaphoretic. Deanna Marshall states that her last alcoholic beverage was may be 2 days ago. However Deanna Marshall did state that Deanna Marshall used Ativan last night. Overall neurologically Deanna Marshall is intact but still not likely at her baseline. Deanna Marshall is got some facial trauma but does not have any neck pain, chest pain, shortness of breath. Deanna Marshall felt generally well before this happened. Overall suspect  possibly arrhythmic event versus alcohol withdrawal versus polysubstance abuse versus seizure disorder. We will get basic labs including CT imaging of the face, neck, head given trauma. Will give IV fluids and IV Ativan and IV Zofran. Anticipate likely admission given abnormal EKG. Upon chart review Deanna Marshall has had an MRI and EEG several years back following a similar event that were unremarkable.  Deanna Marshall with other seizure type episode while awaiting work-up.  Deanna Marshall became briefly hypoxic and tachycardic, episode lasted maybe about 1 minute.  Deanna Marshall was given 2 mg IM Ativan.  I was able to place a ultrasound-guided IV.  Deanna Marshall to be given IV Keppra and neurology has been consulted and will order a stat EEG.  Overall still postictal and anticipate admission.  Suspect this may be likely to polysubstance abuse possibly alcohol withdrawal.  Possibly primary seizure disorder.  Deanna Marshall has no fever.  Doubt infectious process.    CT scan of the head, face, neck was unremarkable.  Lab work was overall unremarkable.  Alcohol negative.  Suspect alcohol withdrawal versus primary seizure process, less likely cardiac process.  Deanna Marshall to get stat EEG and neurology following.  Has been slow to get back to baseline.  This chart was dictated using voice recognition software.  Despite best efforts to proofread,  errors can occur which can change the documentation meaning.     Final Clinical Impression(s) / ED Diagnoses Final diagnoses:  Syncope and collapse  Status epilepticus [facility])    Rx / DC Orders ED Discharge Orders    None       Virgina Norfolk, DO 02/09/20 1523

## 2020-02-09 NOTE — H&P (Signed)
History and Physical    Deanna Marshall VVO:[id number] DOB: 1966-03-16 DOA: 02/09/2020  PCP: Patient, No Pcp Per (Confirm with patient/family/NH records and if not entered, this has to be entered at [facility] point of entry) Patient coming from: Home  I have personally briefly reviewed patient's old medical records in [facility]  Chief Complaint: I need Ativan  HPI: Deanna Marshall is a 54 y.o. 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  Patient was found having seizure-like activity while eating in the bar, and then fell down and hit her head.  Patient was happening, and was very confused when arriving at the ED.  CT head showed left forehead hematoma.  Then patient had another witnessed seizure episode in the ED, by 2 nurses at bedside who described as whole body shaking and twitching.  Patient was given 2 mg IM Ativan and the seizure stopped.  Patient denied any illicit drug use.  Reported she has been on Ativan for several years, and she takes Ativan 1 mg 3 times daily.  Patient reported she drinks only wine occasionally and last drink was 2 days ago. ED Course: EEG negative for seizure Review of Systems: As per HPI otherwise 14 point review of systems negative.    Past Medical History:  Diagnosis Date  . Anxiety and depression   . Asthma    onset age 23 per pt  . Chronic back pain   . COPD (chronic obstructive pulmonary disease) (HCC)   . Drug dependency, combined (HCC)    benzo's and opioids  . Kidney stones   . Malingering   . Psychiatric pseudoseizure     Past Surgical History:  Procedure Laterality Date  . ABDOMINAL HYSTERECTOMY     ovaries still in.  DUB.  No malignancy.  Marland Kitchen BACK SURGERY     L spine x 2.  No hardware.  . CHOLECYSTECTOMY  2012  . INSERTION OF MESH       reports that she has been smoking cigarettes. She has a 10.00 pack-year smoking history. She has never used  smokeless tobacco. She reports previous drug use. She reports that she does not drink alcohol.  Allergies  Allergen Reactions  . Compazine [Prochlorperazine] Hives  . Ivp Dye [Iodinated Diagnostic Agents] Swelling and Other (See Comments)    Reaction:  Facial swelling  . Ketorolac Tromethamine Swelling and Other (See Comments)    Reaction:  Facial swelling  . Morphine And Related Swelling    Family History  Problem Relation Age of Onset  . Cancer Father   . Cancer Mother   . Diabetes Mother   . Hypertension Mother   . Mental illness Mother   . Diabetes Other   . Hypertension Other      Prior to Admission medications   Medication Sig Start Date End Date Taking? Authorizing Provider  acetaminophen (TYLENOL) 325 MG tablet Take 325 mg by mouth every 6 (six) hours as needed for mild pain, moderate pain or headache.     [provider]  albuterol (PROVENTIL HFA;VENTOLIN HFA) 108 (90 Base) MCG/ACT inhaler Inhale 2 puffs into the lungs every 4 (four) hours as needed for wheezing or shortness of breath. 08/14/15   Joy, Shawn C, PA-C  albuterol (PROVENTIL HFA;VENTOLIN HFA) 108 (90 Base) MCG/ACT inhaler Inhale into the lungs. 08/17/15   [provider]  cholecalciferol (VITAMIN D) 1000 units tablet Take by mouth. 06/16/17   [provider]  divalproex (DEPAKOTE) 500 MG DR tablet Take by mouth. 06/26/17 07/26/17  [provider]  divalproex (DEPAKOTE) 500 MG DR tablet Take one tablet (500 mg dose) by mouth at bedtime. 06/26/17   [provider]  estradiol (ESTRACE) 1 MG tablet Take by mouth. 02/13/17   [provider]  estradiol (ESTRACE) 1 MG tablet Take 1 mg by mouth daily. 06/20/17   [provider]  eszopiclone (LUNESTA) 1 MG TABS tablet Take by mouth.    [provider]  Eszopiclone 3 MG TABS Take 1 at night 03/09/17   [provider]  etodolac (LODINE) 400 MG tablet Take by mouth. 04/11/17   [provider]   Fluticasone-Salmeterol (ADVAIR) 250-50 MCG/DOSE AEPB Inhale 1 puff into the lungs 2 (two) times daily. 03/25/16   McGowen, Maryjean Morn, MD  gabapentin (NEURONTIN) 600 MG tablet Take one tablet (600 mg dose) by mouth 3 (three) times a day. 07/11/17   [provider]  LYRICA 50 MG capsule Take one capsule (50 mg total) by mouth 3 (three) times a day. 04/15/17   [provider]  nicotine (NICODERM CQ - DOSED IN MG/24 HOURS) 21 mg/24hr patch Place onto the skin. 06/16/17   [provider]  prazosin (MINIPRESS) 2 MG capsule Take by mouth. 06/26/17 07/26/17  [provider]  prazosin (MINIPRESS) 2 MG capsule TK ONE C PO HS 06/17/17   [provider]  QUEtiapine (SEROQUEL) 50 MG tablet Take 50 mg by mouth at bedtime. 06/09/17   [provider]  traMADol (ULTRAM) 50 MG tablet Take 50 mg by mouth every 8 (eight) hours as needed. for pain 06/18/17   [provider]  venlafaxine XR (EFFEXOR-XR) 150 MG 24 hr capsule Take by mouth. 06/26/17 02/09/20  [provider]  zolpidem (AMBIEN) 10 MG tablet Take 10 mg by mouth at bedtime as needed for sleep.    [provider]  beclomethasone (QVAR) 80 MCG/ACT inhaler Inhale 2 puffs into the lungs 2 (two) times daily. Patient not taking: Reported on 08/14/2015 08/06/15 08/14/15  Wallis Bamberg, PA-C  escitalopram (LEXAPRO) 10 MG tablet Start with 1/2 tablet for 1 week. Then increase to 1 tablet daily. Patient not taking: Reported on 08/14/2015 08/06/15 08/14/15  Wallis Bamberg, PA-C  PARoxetine (PAXIL) 20 MG tablet Take 1 tablet (20 mg total) by mouth daily. Patient not taking: Reported on 03/16/2015 02/28/15 03/16/15  Carmelina Dane, MD    Physical Exam: Vitals:   02/09/20 1400 02/09/20 1415 02/09/20 1615 02/09/20 1630  BP: (!) 102/49 120/80 (!) 111/56 (!) 148/62  Pulse: 69 93 78 81  Resp: 11 (!) 32 16 13  Temp:      TempSrc:      SpO2: 100% (!) 80% 100% 100%  Weight:      Height:        Constitutional:  NAD, calm, comfortable Vitals:   02/09/20 1400 02/09/20 1415 02/09/20 1615 02/09/20 1630  BP: (!) 102/49 120/80 (!) 111/56 (!) 148/62  Pulse: 69 93 78 81  Resp: 11 (!) 32 16 13  Temp:      TempSrc:      SpO2: 100% (!) 80% 100% 100%  Weight:      Height:       Eyes: PERRL, lids and conjunctivae normal ENMT: Mucous membranes are moist. Posterior pharynx clear of any exudate or lesions.Normal dentition.  Neck: normal, supple, no masses, no thyromegaly Respiratory: clear to auscultation bilaterally, no wheezing, no crackles. Normal respiratory  effort. No accessory muscle use.  Cardiovascular: Regular rate and rhythm, no murmurs / rubs / gallops. No extremity edema. 2+ pedal pulses. No carotid bruits.  Abdomen: no tenderness, no masses palpated. No hepatosplenomegaly. Bowel sounds positive.  Musculoskeletal: no clubbing / cyanosis. No joint deformity upper and lower extremities. Good ROM, no contractures. Normal muscle tone.  Skin: no rashes, lesions, ulcers. No induration Neurologic: CN 2-12 grossly intact. Sensation intact, DTR normal. Strength 5/5 in all 4.  Psychiatric: Normal judgment and insight. Alert and oriented x 3. Normal mood.     Labs on Admission: I have personally reviewed following labs and imaging studies  CBC: Recent Labs  Lab 02/09/20 1400  WBC 12.4*  NEUTROABS 8.9*  HGB 17.3*  HCT 51.6*  MCV 98.9  PLT 199   Basic Metabolic Panel: Recent Labs  Lab 02/09/20 1400  NA 136  K 5.0  CL 104  CO2 17*  GLUCOSE 183*  BUN 18  CREATININE 0.98  CALCIUM 9.8  MG 2.0   GFR: Estimated Creatinine Clearance: 57.7 mL/min (by C-G formula based on SCr of 0.98 mg/dL). Liver Function Tests: Recent Labs  Lab 02/09/20 1400  AST 46*  ALT 22  ALKPHOS 70  BILITOT 2.4*  PROT 6.8  ALBUMIN 4.3   No results for input(s): LIPASE, AMYLASE in the last 168 hours. No results for input(s): AMMONIA in the last 168 hours. Coagulation Profile: No results for input(s): INR,  PROTIME in the last 168 hours. Cardiac Enzymes: No results for input(s): CKTOTAL, CKMB, CKMBINDEX, TROPONINI in the last 168 hours. BNP (last 3 results) No results for input(s): PROBNP in the last 8760 hours. HbA1C: No results for input(s): HGBA1C in the last 72 hours. CBG: Recent Labs  Lab 02/09/20 1353  GLUCAP 193*   Lipid Profile: No results for input(s): CHOL, HDL, LDLCALC, TRIG, CHOLHDL, LDLDIRECT in the last 72 hours. Thyroid Function Tests: No results for input(s): TSH, T4TOTAL, FREET4, T3FREE, THYROIDAB in the last 72 hours. Anemia Panel: No results for input(s): VITAMINB12, FOLATE, FERRITIN, TIBC, IRON, RETICCTPCT in the last 72 hours. Urine analysis:    Component Value Date/Time   COLORURINE YELLOW 02/09/2020 1710   APPEARANCEUR TURBID (A) 02/09/2020 1710   LABSPEC 1.028 02/09/2020 1710   PHURINE 5.0 02/09/2020 1710   GLUCOSEU 50 (A) 02/09/2020 1710   HGBUR SMALL (A) 02/09/2020 1710   HGBUR negative 01/20/2009 1039   BILIRUBINUR NEGATIVE 02/09/2020 1710   BILIRUBINUR negative 07/02/2015 1402   KETONESUR 20 (A) 02/09/2020 1710   PROTEINUR 30 (A) 02/09/2020 1710   UROBILINOGEN 0.2 07/02/2015 1402   UROBILINOGEN 0.2 10/24/2012 0641   NITRITE NEGATIVE 02/09/2020 1710   LEUKOCYTESUR TRACE (A) 02/09/2020 1710    Radiological Exams on Admission: EEG  Result Date: 02/09/2020 Charlsie QuestYadav, Priyanka O, MD     02/09/2020  4:09 PM Patient Name: Ubaldo GlassingDawn Deshong MRN: [id number] Epilepsy Attending: Charlsie QuestPriyanka O Yadav Referring Physician/Provider: Dr Brooke DareSrishti Bhagat Date: 02/09/2020 Duration: 24.25 mins Patient history: 54yo F with new seizure like episode. EEG to evaluate for seizure. Level of alertness: Awake AEDs during EEG study: None Technical aspects: This EEG study was done with scalp electrodes positioned according to the 10-20 International system of electrode placement. Electrical activity was acquired at a sampling rate of 500Hz  and reviewed with a high frequency filter of 70Hz  and a low  frequency filter of 1Hz . EEG data were recorded continuously and digitally stored. Description: The posterior dominant rhythm consists of 8 Hz activity of moderate voltage (25-35 uV) seen  predominantly in posterior head regions, symmetric and reactive to eye opening and eye closing. Physiologic photic driving was not seen during photic stimulation.  Hyperventilation was not performed.   IMPRESSION: This study is within normal limits. No seizures or epileptiform discharges were seen throughout the recording. Charlsie Quest   CT Head Wo Contrast  Result Date: 02/09/2020 CLINICAL DATA:  Seizure, facial injury. EXAM: CT HEAD WITHOUT CONTRAST CT MAXILLOFACIAL WITHOUT CONTRAST CT CERVICAL SPINE WITHOUT CONTRAST TECHNIQUE: Multidetector CT imaging of the head, cervical spine, and maxillofacial structures were performed using the standard protocol without intravenous contrast. Multiplanar CT image reconstructions of the cervical spine and maxillofacial structures were also generated. COMPARISON:  None. FINDINGS: CT HEAD FINDINGS Brain: No evidence of acute infarction, hemorrhage, hydrocephalus, extra-axial collection or mass lesion/mass effect. Vascular: No hyperdense vessel or unexpected calcification. Skull: Normal. Negative for fracture or focal lesion. Other: Small left frontal scalp hematoma is noted. CT MAXILLOFACIAL FINDINGS Osseous: No fracture or mandibular dislocation. No destructive process. Orbits: Negative. No traumatic or inflammatory finding. Sinuses: Clear. Soft tissues: Negative. CT CERVICAL SPINE FINDINGS Alignment: Normal. Skull base and vertebrae: No acute fracture. No primary bone lesion or focal pathologic process. Soft tissues and spinal canal: No prevertebral fluid or swelling. No visible canal hematoma. Disc levels: Mild degenerative disc disease is noted at C4-5, C5-6 and C6-7. Upper chest: Negative. Other: None. IMPRESSION: 1. Small left frontal scalp hematoma. No acute intracranial  abnormality seen. 2. No abnormality seen in maxillofacial region. 3. Mild multilevel degenerative disc disease. No acute abnormality seen in the cervical spine. Electronically Signed   By: Lupita Raider M.D.   On: 02/09/2020 15:10   CT Cervical Spine Wo Contrast  Result Date: 02/09/2020 CLINICAL DATA:  Seizure, facial injury. EXAM: CT HEAD WITHOUT CONTRAST CT MAXILLOFACIAL WITHOUT CONTRAST CT CERVICAL SPINE WITHOUT CONTRAST TECHNIQUE: Multidetector CT imaging of the head, cervical spine, and maxillofacial structures were performed using the standard protocol without intravenous contrast. Multiplanar CT image reconstructions of the cervical spine and maxillofacial structures were also generated. COMPARISON:  None. FINDINGS: CT HEAD FINDINGS Brain: No evidence of acute infarction, hemorrhage, hydrocephalus, extra-axial collection or mass lesion/mass effect. Vascular: No hyperdense vessel or unexpected calcification. Skull: Normal. Negative for fracture or focal lesion. Other: Small left frontal scalp hematoma is noted. CT MAXILLOFACIAL FINDINGS Osseous: No fracture or mandibular dislocation. No destructive process. Orbits: Negative. No traumatic or inflammatory finding. Sinuses: Clear. Soft tissues: Negative. CT CERVICAL SPINE FINDINGS Alignment: Normal. Skull base and vertebrae: No acute fracture. No primary bone lesion or focal pathologic process. Soft tissues and spinal canal: No prevertebral fluid or swelling. No visible canal hematoma. Disc levels: Mild degenerative disc disease is noted at C4-5, C5-6 and C6-7. Upper chest: Negative. Other: None. IMPRESSION: 1. Small left frontal scalp hematoma. No acute intracranial abnormality seen. 2. No abnormality seen in maxillofacial region. 3. Mild multilevel degenerative disc disease. No acute abnormality seen in the cervical spine. Electronically Signed   By: Lupita Raider M.D.   On: 02/09/2020 15:10   DG Chest Portable 1 View  Result Date:  02/09/2020 CLINICAL DATA:  Loss of consciousness, seizures EXAM: PORTABLE CHEST 1 VIEW COMPARISON:  Chest x-ray 03/08/2017 FINDINGS: The heart size and mediastinal contours are within normal limits. No focal consolidation. No pulmonary edema. No pleural effusion. No pneumothorax. No acute osseous abnormality. IMPRESSION: No active disease. Electronically Signed   By: Tish Frederickson M.D.   On: 02/09/2020 17:12   CT Maxillofacial Wo Contrast  Result Date: 02/09/2020 CLINICAL DATA:  Seizure, facial injury. EXAM: CT HEAD WITHOUT CONTRAST CT MAXILLOFACIAL WITHOUT CONTRAST CT CERVICAL SPINE WITHOUT CONTRAST TECHNIQUE: Multidetector CT imaging of the head, cervical spine, and maxillofacial structures were performed using the standard protocol without intravenous contrast. Multiplanar CT image reconstructions of the cervical spine and maxillofacial structures were also generated. COMPARISON:  None. FINDINGS: CT HEAD FINDINGS Brain: No evidence of acute infarction, hemorrhage, hydrocephalus, extra-axial collection or mass lesion/mass effect. Vascular: No hyperdense vessel or unexpected calcification. Skull: Normal. Negative for fracture or focal lesion. Other: Small left frontal scalp hematoma is noted. CT MAXILLOFACIAL FINDINGS Osseous: No fracture or mandibular dislocation. No destructive process. Orbits: Negative. No traumatic or inflammatory finding. Sinuses: Clear. Soft tissues: Negative. CT CERVICAL SPINE FINDINGS Alignment: Normal. Skull base and vertebrae: No acute fracture. No primary bone lesion or focal pathologic process. Soft tissues and spinal canal: No prevertebral fluid or swelling. No visible canal hematoma. Disc levels: Mild degenerative disc disease is noted at C4-5, C5-6 and C6-7. Upper chest: Negative. Other: None. IMPRESSION: 1. Small left frontal scalp hematoma. No acute intracranial abnormality seen. 2. No abnormality seen in maxillofacial region. 3. Mild multilevel degenerative disc disease. No  acute abnormality seen in the cervical spine. Electronically Signed   By: Lupita Raider M.D.   On: 02/09/2020 15:10    EKG: Independently reviewed.  Sinus rhythm, QTC punctation.  Assessment/Plan Active Problems:   Seizure (HCC)  (please populate well all problems here in Problem List. (For example, if patient is on BP meds at home and you resume or decide to hold them, it is a problem that needs to be her. Same for CAD, COPD, HLD and so on)  Seizure -EEG negative.  Neurology on board, MRI ordered.  CT head reassuring. -Suspect this is withdrawal seizure from antibiotic.  Discussed with ER pharmacist who will check patient record which showed patient's last prescription for Ativan with in June this year.  Confirmed patient with above information, patient claimed that she is feeling well based on Ativan.  This raised concern about patient been using other drugs.  UDS ordered. -Seizure precautions  Bipolar disorder, PTSD -Continue home regimen, appears patient takes multiple psych medications he will be Depakote, gabapentin, and doxazosin.  Asthma/COPD -No symptoms signs of acute exacerbation continue home regimen.  Prolonged QTC -Patient on SSRI, will repeat EKG for tomorrow.  DVT prophylaxis: Lovenox Code Status: Full code Family Communication: None at bedside Disposition Plan: Seizure and suspected withdrawal and probably polysubstance abuse, expect more than 2 midnight hospital stay Consults called: Neurology Admission status: Telemetry admission   Emeline General MD [facility] Pager [phone]  02/09/2020, 6:36 PM

## 2020-02-09 NOTE — ED Notes (Signed)
Pt was walking around room with gown off, saying she wants to leave. I notified the neurologist and the neurologist is at bedside talking to pt.

## 2020-02-09 NOTE — ED Notes (Addendum)
Pt has hematoma on the left side of forehead the size of a half dollar. Pt has abrasion on the bridge of her nose and above her upper lip. Pt is diapheretic and states she's nauseated. Pt has abrasions of knuckles of both hands bilat.

## 2020-02-09 NOTE — Procedures (Signed)
Patient Name: Deanna Marshall  MRN: [id number]  Epilepsy Attending: Charlsie Quest  Referring Physician/Provider: Dr Brooke Dare Date: 02/09/2020  Duration: 24.25 mins  Patient history: 54yo F with new seizure like episode. EEG to evaluate for seizure.  Level of alertness: Awake  AEDs during EEG study: None  Technical aspects: This EEG study was done with scalp electrodes positioned according to the 10-20 International system of electrode placement. Electrical activity was acquired at a sampling rate of 500Hz  and reviewed with a high frequency filter of 70Hz  and a low frequency filter of 1Hz . EEG data were recorded continuously and digitally stored.   Description: The posterior dominant rhythm consists of 8 Hz activity of moderate voltage (25-35 uV) seen predominantly in posterior head regions, symmetric and reactive to eye opening and eye closing. Physiologic photic driving was not seen during photic stimulation.  Hyperventilation was not performed.     IMPRESSION: This study is within normal limits. No seizures or epileptiform discharges were seen throughout the recording.  Deanna Marshall 

## 2020-02-09 NOTE — ED Notes (Signed)
Pt has vomited 3 times.

## 2020-02-09 NOTE — Consult Note (Addendum)
Neurology Consultation Reason for Consult: Status epilepticus Referring Physician: Mikey College  CC: "I don't remember what happened"  History is obtained from: Patient and chart review   HPI: Deanna Marshall is a 54 y.o. female with a past medical history significant for opiate use disorder, PTSD, chronic benzodiazepine use for anxiety.  She remains confused at the time of my examination reporting initially that it is August 2001, able to correct the year to 2021 by herself but not month.  She does provide a consistent history to me and the ED provider, and that she remembers getting barbecue and that has a lapse in her memory.  Per chart review she had a bystander witnessed event concerning for generalized tonic-clonic seizure without report of any focal activity in the field. She then had another witnessed event in the ED, which nursing describes as generalized tonic-clonic activity without any evidence of focal gaze deviation or asymmetric posturing etc.  She reports that she ran out of her Xanax 2 days ago because she just did not get a refill in time (which differs from history provided to ED to him she reportedly stated she took Xanax last night).  She denies taking more than prescribed.  She reports that she takes 1 mg twice daily consistently and that this is prescribed by her psychiatrist.  She additionally reports taking duloxetine but denies taking any other medications.  On review of systems she denies any recent headaches prior to today, denies any visual difficulties or hearing changes, denies any prior seizures, denies any prior head trauma or significant CNS infections, reports normal birth and development, denies any other substance use now or in the past (despite chart report of opiate use disorder and prior U tox positive for methadone), specifically denying methadone and opiates, reports occasional alcohol use (2 glasses of wine, twice a month, last drink several days ago) reports ongoing  smoking 2 packs/day, reports she is on disability due to back surgery on her lower back, reports some cervical pain, denies any focal weakness numbness or tingling, denies any palpitations, cough, cold, recent sick contacts, photophobia, phonophobia, fevers, chills, endorses 20 pounds of weight loss over the last 2 months due to reduced appetite without any abdominal pain, diarrhea, constipation, loss of bladder or bowel sensation   ROS: A 14 point ROS was performed and is negative except as noted in the HPI.  Past Medical History:  Diagnosis Date  . Anxiety and depression   . Asthma    onset age 5 per pt  . Chronic back pain   . COPD (chronic obstructive pulmonary disease) (HCC)   . Drug dependency, combined (HCC)    benzo's and opioids  . Kidney stones   . Malingering   . Psychiatric pseudoseizure     Family History  Problem Relation Age of Onset  . Cancer Father   . Cancer Mother   . Diabetes Mother   . Hypertension Mother   . Mental illness Mother   . Diabetes Other   . Hypertension Other     Social History:  reports that she has been smoking cigarettes. She has a 10.00 pack-year smoking history. She has never used smokeless tobacco. She reports previous drug use. She reports that she does not drink alcohol.   Exam: Current vital signs: BP 120/80   Pulse 93   Temp 97.6 F (36.4 C) (Oral)   Resp (!) 32   Ht 5\' 5"  (1.651 m)   Wt 63.5 kg   SpO2 )  80%   BMI 23.30 kg/m  Vital signs in last 24 hours: Temp:  [97.6 F (36.4 C)] 97.6 F (36.4 C) (11/03 1330) Pulse Rate:  [69-93] 93 (11/03 1415) Resp:  [11-32] 32 (11/03 1415) BP: (102-120)/(49-80) 120/80 (11/03 1415) SpO2:  [80 %-100 %] 80 % (11/03 1415) Weight:  [63.5 kg] 63.5 kg (11/03 1339)   Physical Exam  Constitutional: Appears well-developed and well-nourished.  Psych: Affect flat  Eyes: No scleral injection HENT: No OP obstruction, multiple facial abrasions MSK: no joint deformities.  Cardiovascular:  Normal rate and regular rhythm.  Respiratory: Effort normal, non-labored breathing GI: Soft.  No distension. There is no tenderness.  Skin: Bilateral knuckle abrasions and scattered abrasions on the limbs in addition to the face as above   Neuro: Mental Status: Patient is awake, alert, oriented to person, place, year but not month, and situation. Patient is able to give a clear and coherent history though does not remember loss of consciousness event  No signs of aphasia or neglect Cranial Nerves: II: Visual Fields are full. Pupils are equal, round, and reactive to light. 5 -> 2 mm  III,IV, VI: EOMI without ptosis or diploplia though some saccadic intrusions V: Facial sensation is symmetric to light touch VII: Facial movement is symmetric.  VIII: hearing is intact to finger rub X: Uvula elevates symmetrically XI: Shoulder shrug is symmetric. XII: tongue is midline without atrophy or fasciculations.  Motor: Tone is normal. Bulk is normal. 5/5 strength was present in all four extremities, perhaps just the slightest bit weaker (4++) on the left but improves with coaching. Asterixis more prominent on the left than the right Sensory: Sensation is symmetric to light touch in the arms and legs without extinction to DSS Deep Tendon Reflexes: 3+ and symmetric in the biceps and patellae. No clonus Plantars: Toes are mute bilaterally. Cerebellar: FNF with mild dysmetria bilaterally   I have reviewed labs in epic and the results pertinent to this consultation are: Mild AST elevation of 45, normal BUN of 19, WBC 12.4, increased hemoglobin of 17.3   I have reviewed the images obtained Head CT and CT spine w/o acute process   EEG normal  Impression: This is a 54 year old woman with no prior history of seizures presenting with several episodes of generalized tonic-clonic seizures. Suspect that this is secondary to benzodiazepine withdrawal given that it is the right time period (2 days after  her last intake per her report to me). Alternatively, her asterixis and repeated emesis suggest there may be another toxidrome play leading to reduced seizure threshold. Ethanol withdrawal is also possibility although she denies significant drinking, she is unfortunately an unreliable historian based on chart review. However given that she had multiple seizures, will obtain work-up to confirm we are not missing a treatable etiology.   Recommendations:  #Generalized tonic-clonic seizures, likely provoked -EEG -Head CT recommended after second event, normal -MRI brain without contrast, seizure protocol -No indication for LP at this time -Agree with UA, chest x-ray, Utox  -Resume home benzo dosing after confirming with a reliable source such as pharmacy,  -Leukocytosis is possibly reactive secondary to seizure, continue to monitor for infection, and trend to confirm normalization -Agree with ammonia and supportive care for potential toxidrome per primary team, monitor for resolution of asterixis -No indication for antiseizure medication if MRI brain is normal, if evidence of structural lesion or acute process please page neurology  -No indication for lumbar puncture at this time, but if patient becomes febrile  or otherwise decompensates, this should be considered and neurology should be reinvolved -Patient counseled on seizure precautions including not driving, reporting to the DMV, not bathing alone or performing activities alone where sudden loss of consciousness could lead to serious injury to self or others, such as swimming, climbing ladders etc. Please counsel patient again prior to discharge given that she is still somewhat confused at this time  -Neurology will be available on an as-needed basis going forward, please page if new questions arise  # Elevated hemoglobin - Trend, workup per primary team  Brooke Dare MD-PhD [facility] [phone]

## 2020-02-09 NOTE — ED Notes (Signed)
Checked patient blood sugar it was 193 notified RN of blood sugar

## 2020-02-09 NOTE — Progress Notes (Signed)
EEG complete - results pending 

## 2020-02-09 NOTE — ED Notes (Signed)
Ativan 1 mg was originally ordered, pt started seizing so I pulled ativan 2mg  out of the pyxis and gave the full 2mg  IM.

## 2020-02-09 NOTE — ED Triage Notes (Signed)
Pt arrived via [facility] from a [facility]Q place. Pt was sitting on bench there and had a full body seizure and fell to ground hitting her head. Witnesses told EMS pt had a seizure 2-77mins. Per EMS pt has a hematoma on forehead and has minor cuts on knuckles. Per EMS pt was post tictal on scene and was confused the entire ride. Pt denies neck pain or back pain. Pt had dr appt today and her memory is fuzzy from dr appt to fall. Pt is sinus tach on monitor. VSS. Per EMS pt denies taking meds or having seizures in past. Pt is A&Ox3 to place, person, and situation.

## 2020-02-09 NOTE — ED Notes (Signed)
Pt refused the last set of VS

## 2020-04-14 DIAGNOSIS — N179 Acute kidney failure, unspecified: Secondary | ICD-10-CM | POA: Insufficient documentation

## 2020-04-16 DIAGNOSIS — F1990 Other psychoactive substance use, unspecified, uncomplicated: Secondary | ICD-10-CM | POA: Insufficient documentation

## 2020-04-16 DIAGNOSIS — Z7289 Other problems related to lifestyle: Secondary | ICD-10-CM | POA: Insufficient documentation

## 2020-04-16 DIAGNOSIS — Z789 Other specified health status: Secondary | ICD-10-CM | POA: Insufficient documentation

## 2020-04-25 DIAGNOSIS — Z7409 Other reduced mobility: Secondary | ICD-10-CM | POA: Insufficient documentation

## 2020-04-26 DIAGNOSIS — M792 Neuralgia and neuritis, unspecified: Secondary | ICD-10-CM | POA: Insufficient documentation

## 2020-04-26 DIAGNOSIS — K5903 Drug induced constipation: Secondary | ICD-10-CM | POA: Insufficient documentation

## 2020-04-27 DIAGNOSIS — D75839 Thrombocytosis, unspecified: Secondary | ICD-10-CM | POA: Insufficient documentation

## 2020-05-25 DIAGNOSIS — Z452 Encounter for adjustment and management of vascular access device: Secondary | ICD-10-CM | POA: Insufficient documentation

## 2020-10-13 LAB — HM MAMMOGRAPHY

## 2020-11-07 ENCOUNTER — Ambulatory Visit: Payer: Medicare HMO | Admitting: Podiatry

## 2020-11-14 ENCOUNTER — Ambulatory Visit: Payer: Medicare HMO | Admitting: Podiatry

## 2020-11-21 ENCOUNTER — Ambulatory Visit: Payer: Medicare HMO | Admitting: Podiatry

## 2020-11-24 ENCOUNTER — Ambulatory Visit (INDEPENDENT_AMBULATORY_CARE_PROVIDER_SITE_OTHER): Payer: Medicare HMO

## 2020-11-24 ENCOUNTER — Ambulatory Visit (INDEPENDENT_AMBULATORY_CARE_PROVIDER_SITE_OTHER): Payer: Medicare HMO | Admitting: Podiatry

## 2020-11-24 ENCOUNTER — Other Ambulatory Visit: Payer: Self-pay

## 2020-11-24 ENCOUNTER — Encounter: Payer: Self-pay | Admitting: Podiatry

## 2020-11-24 VITALS — BP 103/63 | HR 66 | Temp 97.4°F | Resp 16

## 2020-11-24 DIAGNOSIS — L97529 Non-pressure chronic ulcer of other part of left foot with unspecified severity: Secondary | ICD-10-CM

## 2020-11-24 DIAGNOSIS — G629 Polyneuropathy, unspecified: Secondary | ICD-10-CM

## 2020-11-24 DIAGNOSIS — L97522 Non-pressure chronic ulcer of other part of left foot with fat layer exposed: Secondary | ICD-10-CM | POA: Diagnosis not present

## 2020-11-24 MED ORDER — SANTYL 250 UNIT/GM EX OINT: 1.0000 "application " | TOPICAL_OINTMENT | Freq: Every day | CUTANEOUS | 0 refills | Status: DC

## 2020-11-24 NOTE — Progress Notes (Signed)
Subjective:   Patient ID: Deanna Marshall, female   DOB: 55 y.o.   MRN: [id number]   HPI 55 year old female presents the office today for concerns of a chronic ulceration to the left heel which is ongoing about 4 months.  She states it started after piece of skin was pulled off.  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  She has not had any odor that she reports will get some bloody drainage at times.  No purulence.  She states that she does not have any feeling to the area.  She does not have a history of compartment syndrome on the left leg which occurred about 6 months ago after a fall.  Labs:  11/06/2020 WBC 3.7 - 11.0 thou/mcL 7.1   RBC 4.01 - 4.90 million/mcL 4.30   HGB 12.2 - 14.9 gm/dL 30.0 Low    HCT 92.3 - 47.9 % 37.6   MCV 82 - 98 fL 87   MCH 27.0 - 33.0 pg 27.7   MCHC 31.0 - 37.0 gm/dL 30.0   Plt Ct 762 - 263 thou/mcL 253   RDW SD 36.0 - 47.0 fL 50.0 High    MPV 8.9 - 11.2 fL 9.1   NRBC% 0 /100WBC 0.0   NRBC 0 thou/mcL 0.000   NEUTROPHIL % 50.0 - 70.0 % 57.6   LYMPHOCYTE % 25.0 - 40.0 % 29.0   MONOCYTE % 4.0 - 12.0 % 9.2   Eosinophil % 1.0 - 6.0 % 3.2   BASOPHIL % 0.0 - 2.0 % 0.7   IG% 0.001 - 0.429 % 0.300   ABSOLUTE NEUTROPHIL COUNT 1.50 - 7.50 thou/mcL 4.09   ABSOLUTE LYMPHOCYTE COUNT 1.0 - 4.5 thou/mcL 2.1   MONO ABSOLUTE 0.1 - 0.8 thou/mcL 0.7   EOS ABSOLUTE 0.0 - 0.5 thou/mcL 0.2   BASO ABSOLUTE 0.0 - 0.2 thou/mcL 0.1   IG ABSOLUTE 0.001 - 0.031 thou/mcL 0.020     Review of Systems  All other systems reviewed and are negative. Past Medical History:  Diagnosis Date   Anxiety and depression    Asthma    onset age 55 per pt   Chronic back pain    COPD (chronic obstructive pulmonary disease) (HCC)    Drug dependency, combined (HCC)    benzo's and opioids   Kidney stones    Malingering    Psychiatric pseudoseizure     Past Surgical History:  Procedure Laterality Date    ABDOMINAL HYSTERECTOMY     ovaries still in.  DUB.  No malignancy.   BACK SURGERY     L spine x 2.  No hardware.   CHOLECYSTECTOMY  2012   INSERTION OF MESH       Current Outpatient Medications:    Baclofen 5 MG TABS, Take by mouth., Disp: , Rfl:    budesonide-formoterol (SYMBICORT) 160-4.5 MCG/ACT inhaler, Inhale 2 puffs into the lungs 2 (two) times daily., Disp: , Rfl:    Cholecalciferol 25 MCG (1000 UT) tablet, Take by mouth., Disp: , Rfl:    clonazePAM (KLONOPIN) 0.5 MG tablet, Take by mouth., Disp: , Rfl:    collagenase (SANTYL) ointment, Apply 1 application topically daily., Disp: 90 g, Rfl: 0   hydrOXYzine (VISTARIL) 50 MG capsule, , Disp: , Rfl:    ibuprofen (ADVIL) 800 MG tablet, Take by mouth., Disp: , Rfl:    mirtazapine (REMERON SOL-TAB) 15 MG disintegrating tablet,  Take by mouth., Disp: , Rfl:    omeprazole (PRILOSEC) 40 MG capsule, Take by mouth., Disp: , Rfl:    pregabalin (LYRICA) 25 MG capsule, Take by mouth., Disp: , Rfl:    solifenacin (VESICARE) 10 MG tablet, Take by mouth., Disp: , Rfl:    acetaminophen (TYLENOL) 325 MG tablet, Take 325 mg by mouth every 6 (six) hours as needed for mild pain, moderate pain or headache. , Disp: , Rfl:    albuterol (PROVENTIL HFA;VENTOLIN HFA) 108 (90 Base) MCG/ACT inhaler, Inhale 2 puffs into the lungs every 4 (four) hours as needed for wheezing or shortness of breath., Disp: 1 Inhaler, Rfl: 1   albuterol (PROVENTIL HFA;VENTOLIN HFA) 108 (90 Base) MCG/ACT inhaler, Inhale into the lungs., Disp: , Rfl:    buprenorphine-naloxone (SUBOXONE) 2-0.5 mg SUBL SL tablet, Place under the tongue., Disp: , Rfl:    cholecalciferol (VITAMIN D) 1000 units tablet, Take by mouth., Disp: , Rfl:    divalproex (DEPAKOTE) 500 MG DR tablet, Take by mouth., Disp: , Rfl:    divalproex (DEPAKOTE) 500 MG DR tablet, Take one tablet (500 mg dose) by mouth at bedtime., Disp: , Rfl: 0   estradiol (ESTRACE) 1 MG tablet, Take by mouth., Disp: , Rfl:    estradiol  (ESTRACE) 1 MG tablet, Take 1 mg by mouth daily., Disp: , Rfl: 11   eszopiclone (LUNESTA) 1 MG TABS tablet, Take by mouth., Disp: , Rfl:    Eszopiclone 3 MG TABS, Take 1 at night, Disp: , Rfl:    etodolac (LODINE) 400 MG tablet, Take by mouth., Disp: , Rfl:    Fluticasone-Salmeterol (ADVAIR) 250-50 MCG/DOSE AEPB, Inhale 1 puff into the lungs 2 (two) times daily., Disp: 60 each, Rfl: 3   gabapentin (NEURONTIN) 600 MG tablet, Take one tablet (600 mg dose) by mouth 3 (three) times a day., Disp: , Rfl: 0   LYRICA 50 MG capsule, Take one capsule (50 mg total) by mouth 3 (three) times a day., Disp: , Rfl: 2   nicotine (NICODERM CQ - DOSED IN MG/24 HOURS) 21 mg/24hr patch, Place onto the skin., Disp: , Rfl:    prazosin (MINIPRESS) 2 MG capsule, Take by mouth., Disp: , Rfl:    prazosin (MINIPRESS) 2 MG capsule, TK ONE C PO HS, Disp: , Rfl: 0   QUEtiapine (SEROQUEL) 50 MG tablet, Take 50 mg by mouth at bedtime., Disp: , Rfl: 0   traMADol (ULTRAM) 50 MG tablet, Take 50 mg by mouth every 8 (eight) hours as needed. for pain, Disp: , Rfl: 0   venlafaxine XR (EFFEXOR-XR) 150 MG 24 hr capsule, Take by mouth., Disp: , Rfl:    zolpidem (AMBIEN) 10 MG tablet, Take 10 mg by mouth at bedtime as needed for sleep., Disp: , Rfl:   Allergies  Allergen Reactions   Iodine-131 Swelling and Hives    IV contrast dye-pt has to be given pre-medications     Compazine [Prochlorperazine] Hives   Ivp Dye [Iodinated Diagnostic Agents] Swelling and Other (See Comments)    Reaction:  Facial swelling   Ketorolac Tromethamine Swelling and Other (See Comments)    Reaction:  Facial swelling   Morphine And Related Swelling          Objective:  Physical Exam  General: AAO x3, NAD  Dermatological: Ulceration present the lateral aspect of the left heel measuring 4 x 2.5 cm with a fibrotic wound base.  Hyperkeratotic periwound.  There is no drainage or pus identified there is no  fluctuation or crepitation.  There is no malodor.   Fasciotomy incisions are healed.  Vascular: Dorsalis Pedis artery and Posterior Tibial artery pedal pulses are palpable 2/4 bilateral with immedate capillary fill time. There is no pain with calf compression, swelling, warmth, erythema.   Neruologic: Sensation decreased  Musculoskeletal: No tenderness.  Muscular strength 5/5 in all groups tested bilateral.  Gait: Unassisted, Nonantalgic.       Assessment:   Chronic left heel ulceration     Plan:  -Treatment options discussed including all alternatives, risks, and complications -Etiology of symptoms were discussed -X-rays obtained reviewed.  No definitive evidence of acute osteomyelitis.  We will await radiology report. -Sharply debrided the wound to a lesser #312 with scalpel to debride the wound down to healthy, bleeding tissue but the wound still remains fiber granular.  The wound size was the same in diameter after debridement. -We will switch using Santyl which I ordered today. -Continue offloading. -We discussed casting however as of right now okay to hold off on this until the wound becomes healthier.  We can always consider total contact cast or graft if needed -I called and spoke to the patient's home health nurse, Maryelizabeth Kaufmann [phone].  Per the nurse that she states that the wound is almost healed previously but the patient walks a lot on the open back up.  I gave her verbal orders for updated wound care dressings.  Return in about 2 weeks (around 12/08/2020).  Vivi Barrack DPM

## 2020-11-27 ENCOUNTER — Telehealth: Payer: Self-pay | Admitting: *Deleted

## 2020-11-27 NOTE — Telephone Encounter (Signed)
Jasmine w/ [facility] is calling to request the missing information for the day supply alteration of therapy for the prescription(Santyl-250 units per gm). Please advise. May be reached at:[phone].

## 2020-11-28 NOTE — Telephone Encounter (Signed)
Called and spoke with the pharmacist and got the corrected supply. Deanna Marshall

## 2020-12-11 DIAGNOSIS — F1910 Other psychoactive substance abuse, uncomplicated: Secondary | ICD-10-CM | POA: Insufficient documentation

## 2020-12-15 ENCOUNTER — Ambulatory Visit: Payer: Medicare HMO | Admitting: Podiatry

## 2020-12-25 ENCOUNTER — Telehealth: Payer: Self-pay | Admitting: Podiatry

## 2020-12-25 NOTE — Telephone Encounter (Signed)
Deanna Marshall from [facility] called wanting to know if we could increase Deanna Marshall nurse visits to 3x per week.

## 2020-12-25 NOTE — Telephone Encounter (Signed)
I called Deanna Marshall back to let her know that Dr.wagoner states it is ok to increase [name] nurse visit to 3x per week.

## 2020-12-27 ENCOUNTER — Telehealth: Payer: Self-pay | Admitting: Podiatry

## 2020-12-27 NOTE — Telephone Encounter (Signed)
[place] from [facility] called about [facility] stating her left heel has decrease in health wise and it looks infected . She states that she is sending her to the hospital . [facility] ) She states the whole heel a broken down and she has a lot of drainage.   [phone]- Myrtie Neither

## 2021-01-11 ENCOUNTER — Ambulatory Visit: Payer: Medicare HMO | Admitting: Podiatry

## 2021-02-21 DIAGNOSIS — Z86718 Personal history of other venous thrombosis and embolism: Secondary | ICD-10-CM | POA: Insufficient documentation

## 2022-05-13 ENCOUNTER — Encounter: Payer: Self-pay | Admitting: Family Medicine

## 2022-05-13 ENCOUNTER — Ambulatory Visit (INDEPENDENT_AMBULATORY_CARE_PROVIDER_SITE_OTHER): Payer: Medicare PPO | Admitting: Family Medicine

## 2022-05-13 VITALS — BP 117/75 | HR 78 | Temp 98.0°F | Ht 65.0 in | Wt 136.4 lb

## 2022-05-13 DIAGNOSIS — G546 Phantom limb syndrome with pain: Secondary | ICD-10-CM

## 2022-05-13 DIAGNOSIS — Z7689 Persons encountering health services in other specified circumstances: Secondary | ICD-10-CM

## 2022-05-13 NOTE — Progress Notes (Signed)
New Patient Office Visit  Subjective    Patient ID: Deanna Marshall, female    DOB: 15-Mar-1966  Age: 57 y.o. MRN: [id number]  CC:  Chief Complaint  Patient presents with   Establish Care    Transferring care from [facility] Says that she wanted to see her bi weekly.    Amputation    Left leg; Nerve pain wants to be treated monthly.   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 She was started on Gabapentin and titrated up. She reports Gabapentin made her stomach upset and burn. She says this was then switched to Lyrica but the Gabapentin made her pain improve. She has been working with pain clinic with [facility]. She recently got Gabapentin refilled on 1/12. She requests 800 mg dose and was put on 600 mg.  Pt is on Suboxone through [facility] center also.  Pt was seen by urgent care on 2/3 and received Gabapentin 600mg  QID x 7 days. She then went to urgent care yesterday and received a rx for Lyrica but reports she never got filled? She has COPD and taking Trelegy and Albuterol inhaler with nebulizer. Pt has mental health provider and using Ambien prn for sleep along with Valium. Of note, pt brought mother in last week for similar refills on Gabapentin. This was filled on Jan 29. She then called today and reported they left the medicine at [facility] and the son was going to mail it back. They requested a short supply be sent to pharmacy until they do so. Pharmacist reports similar situations with pt and medicines being filled. Pt today reports he's on a ventilator. When asked, she says his wife will be shipping the medicine back.   Outpatient Encounter Medications as of 05/13/2022  Medication Sig   acetaminophen (TYLENOL) 325 MG tablet Take 325 mg by mouth every 6 (six) hours as needed for mild pain, moderate pain or headache.    albuterol (PROVENTIL HFA;VENTOLIN HFA) 108 (90 Base) MCG/ACT inhaler  Inhale 2 puffs into the lungs every 4 (four) hours as needed for wheezing or shortness of breath.   buprenorphine-naloxone (SUBOXONE) 2-0.5 mg SUBL SL tablet Place under the tongue.   cholecalciferol (VITAMIN D) 1000 units tablet Take by mouth.   Eszopiclone 3 MG TABS Take 1 at night   Fluticasone-Umeclidin-Vilant (TRELEGY ELLIPTA) 100-62.5-25 MCG/ACT AEPB Inhale 1 puff into the lungs daily.   LYRICA 50 MG capsule Take one capsule (50 mg total) by mouth 3 (three) times a day.   zolpidem (AMBIEN) 10 MG tablet Take 10 mg by mouth at bedtime as needed for sleep.   [DISCONTINUED] gabapentin (NEURONTIN) 600 MG tablet Take one tablet (600 mg dose) by mouth 3 (three) times a day.   divalproex (DEPAKOTE) 500 MG DR tablet Take by mouth.   nicotine (NICODERM CQ - DOSED IN MG/24 HOURS) 21 mg/24hr patch Place onto the skin. (Patient not taking: Reported on 05/13/2022)   omeprazole (PRILOSEC) 40 MG capsule Take by mouth. (Patient not taking: Reported on 05/13/2022)   prazosin (MINIPRESS) 2 MG capsule Take by mouth.   [DISCONTINUED] albuterol (PROVENTIL HFA;VENTOLIN HFA) 108 (90 Base) MCG/ACT inhaler Inhale into the lungs. (Patient not taking: Reported on 05/13/2022)   [DISCONTINUED] Baclofen 5 MG TABS Take by mouth. (Patient not taking: Reported on 05/13/2022)   [DISCONTINUED] beclomethasone (QVAR) 80 MCG/ACT inhaler Inhale 2 puffs into the lungs  2 (two) times daily. (Patient not taking: Reported on 08/14/2015)   [DISCONTINUED] budesonide-formoterol (SYMBICORT) 160-4.5 MCG/ACT inhaler Inhale 2 puffs into the lungs 2 (two) times daily. (Patient not taking: Reported on 05/13/2022)   [DISCONTINUED] Cholecalciferol 25 MCG (1000 UT) tablet Take by mouth. (Patient not taking: Reported on 05/13/2022)   [DISCONTINUED] clonazePAM (KLONOPIN) 0.5 MG tablet Take by mouth. (Patient not taking: Reported on 05/13/2022)   [DISCONTINUED] collagenase (SANTYL) ointment Apply 1 application topically daily. (Patient not taking: Reported on  05/13/2022)   [DISCONTINUED] divalproex (DEPAKOTE) 500 MG DR tablet Take one tablet (500 mg dose) by mouth at bedtime. (Patient not taking: Reported on 05/13/2022)   [DISCONTINUED] escitalopram (LEXAPRO) 10 MG tablet Start with 1/2 tablet for 1 week. Then increase to 1 tablet daily. (Patient not taking: Reported on 08/14/2015)   [DISCONTINUED] estradiol (ESTRACE) 1 MG tablet Take by mouth. (Patient not taking: Reported on 05/13/2022)   [DISCONTINUED] estradiol (ESTRACE) 1 MG tablet Take 1 mg by mouth daily. (Patient not taking: Reported on 05/13/2022)   [DISCONTINUED] eszopiclone (LUNESTA) 1 MG TABS tablet Take by mouth. (Patient not taking: Reported on 05/13/2022)   [DISCONTINUED] etodolac (LODINE) 400 MG tablet Take by mouth. (Patient not taking: Reported on 05/13/2022)   [DISCONTINUED] Fluticasone-Salmeterol (ADVAIR) 250-50 MCG/DOSE AEPB Inhale 1 puff into the lungs 2 (two) times daily. (Patient not taking: Reported on 05/13/2022)   [DISCONTINUED] hydrOXYzine (VISTARIL) 50 MG capsule  (Patient not taking: Reported on 05/13/2022)   [DISCONTINUED] ibuprofen (ADVIL) 800 MG tablet Take by mouth. (Patient not taking: Reported on 05/13/2022)   [DISCONTINUED] mirtazapine (REMERON SOL-TAB) 15 MG disintegrating tablet Take by mouth. (Patient not taking: Reported on 05/13/2022)   [DISCONTINUED] PARoxetine (PAXIL) 20 MG tablet Take 1 tablet (20 mg total) by mouth daily. (Patient not taking: Reported on 03/16/2015)   [DISCONTINUED] prazosin (MINIPRESS) 2 MG capsule TK ONE C PO HS (Patient not taking: Reported on 05/13/2022)   [DISCONTINUED] pregabalin (LYRICA) 25 MG capsule Take by mouth. (Patient not taking: Reported on 05/13/2022)   [DISCONTINUED] QUEtiapine (SEROQUEL) 50 MG tablet Take 50 mg by mouth at bedtime. (Patient not taking: Reported on 05/13/2022)   [DISCONTINUED] solifenacin (VESICARE) 10 MG tablet Take by mouth. (Patient not taking: Reported on 05/13/2022)   [DISCONTINUED] traMADol (ULTRAM) 50 MG tablet Take 50 mg by mouth  every 8 (eight) hours as needed. for pain (Patient not taking: Reported on 05/13/2022)   [DISCONTINUED] venlafaxine XR (EFFEXOR-XR) 150 MG 24 hr capsule Take by mouth.   No facility-administered encounter medications on file as of 05/13/2022.    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  DUB.  No malignancy.   BACK SURGERY     L spine x 2.  No hardware.   CHOLECYSTECTOMY  2012   INSERTION OF MESH      Family History  Problem Relation Age of Onset   Cancer Father    Cancer Mother    Diabetes Mother    Hypertension Mother    Mental illness Mother    Diabetes Other  Hypertension Other     Social History   Socioeconomic History   Marital status: Widowed    Spouse name: Not on file   Number of children: Not on file   Years of education: Not on file   Highest education level: Not on file  Occupational History   Not on file  Tobacco Use   Smoking status: Every Day    Packs/day: 0.50    Years: 20.00    Total pack years: 10.00    Types: Cigarettes   Smokeless tobacco: Never  Substance and Sexual Activity   Alcohol use: No    Alcohol/week: 0.0 standard drinks of alcohol   Drug use: Not Currently   Sexual activity: Yes    Birth control/protection: Surgical  Other Topics Concern   Not on file  Social History Narrative   Widow since 2008.  She has 2 children.   Educ: HS   Occup: Disability for chronic low back pain and psychiatric reasons.   Social Determinants of Health   Financial Resource Strain: Not on file  Food Insecurity: Not on file  Transportation Needs: Not on file  Physical Activity: Not on file  Stress:  Not on file  Social Connections: Not on file  Intimate Partner Violence: Not on file    Review of Systems  Musculoskeletal:  Positive for joint pain.  All other systems reviewed and are negative.       Objective    BP 117/75   Pulse 78   Temp 98 F (36.7 C) (Oral)   Ht 5\' 5"  (1.651 m)   Wt 136 lb 6.4 oz (61.9 kg)   SpO2 97%   BMI 22.70 kg/m   Physical Exam Vitals and nursing note reviewed.  Constitutional:      Appearance: Normal appearance. She is normal weight.  HENT:     Head: Normocephalic and atraumatic.     Right Ear: External ear normal.     Left Ear: External ear normal.     Nose: Nose normal.  Eyes:     Extraocular Movements: Extraocular movements intact.  Cardiovascular:     Rate and Rhythm: Normal rate.  Pulmonary:     Effort: Pulmonary effort is normal.  Neurological:     General: No focal deficit present.     Mental Status: She is alert. Mental status is at baseline.  Psychiatric:        Mood and Affect: Mood normal.        Behavior: Behavior normal.        Thought Content: Thought content normal.        Judgment: Judgment normal.         Assessment & Plan:   Problem List Items Addressed This Visit   None Visit Diagnoses     Encounter to establish care with new doctor    -  Primary   Phantom limb pain ([place])          Have advised pt to continue with current pain management as she is getting Suboxone. Will not be managing chronic pain. She has already received Gabapentin from her pain provider on 1/12 and received another rx from urgent care on 2/3.  Will see back for AWV  Return in about 2 months (around 07/12/2022) for Sub AWV.   Leeanne Rio, MD

## 2022-05-14 ENCOUNTER — Encounter: Payer: Self-pay | Admitting: Family Medicine

## 2022-05-14 ENCOUNTER — Ambulatory Visit (INDEPENDENT_AMBULATORY_CARE_PROVIDER_SITE_OTHER): Payer: Medicare PPO | Admitting: Family Medicine

## 2022-05-14 VITALS — BP 142/83 | HR 85 | Temp 98.3°F | Ht 65.0 in | Wt 138.0 lb

## 2022-05-14 DIAGNOSIS — G894 Chronic pain syndrome: Secondary | ICD-10-CM | POA: Diagnosis not present

## 2022-05-14 DIAGNOSIS — G546 Phantom limb syndrome with pain: Secondary | ICD-10-CM | POA: Diagnosis not present

## 2022-05-14 MED ORDER — GABAPENTIN 300 MG PO CAPS: 300.0000 mg | ORAL_CAPSULE | Freq: Three times a day (TID) | ORAL | 0 refills | Status: AC

## 2022-05-14 NOTE — Progress Notes (Signed)
   Acute Office Visit  Subjective:     Patient ID: Deanna Marshall, female    DOB: November 03, 1965, 57 y.o.   MRN: [id number]  Chief Complaint  Patient presents with   Pain Management    Pt is complaining of nerve pain. She also states that she needs Gabapentin 800mg .     HPI Patient is in today for acute visit.  Pt reports she would like me to take over her Gabapentin. She was on 800mg  4 times a day and this helps her nerve pain. She was established with [facility] center. She says she would like to switch to here because they wanted her seen bi-weekly. She says that provider will continue prescribing her Suboxone. Pt is also seeing psychiatrist and taking Ambien at night.  Review of Systems  Musculoskeletal:        Left phantom leg pain        Objective:    BP (!) 142/83   Pulse 85   Temp 98.3 F (36.8 C) (Oral)   Ht 5\' 5"  (1.651 m)   Wt 138 lb (62.6 kg)   SpO2 99%   BMI 22.96 kg/m    Physical Exam Vitals and nursing note reviewed.  Constitutional:      Appearance: Normal appearance. She is normal weight.  HENT:     Right Ear: External ear normal.     Left Ear: External ear normal.  Musculoskeletal:     Comments: Left BKA with prosthesis  Skin:    Capillary Refill: Capillary refill takes less than 2 seconds.  Neurological:     General: No focal deficit present.     Mental Status: She is alert. Mental status is at baseline.  Psychiatric:        Mood and Affect: Mood normal.        Behavior: Behavior normal.     Comments: anxious    No results found for any visits on 05/14/22.      Assessment & Plan:   Problem List Items Addressed This Visit       Other   Chronic pain syndrome   Relevant Medications   gabapentin (NEURONTIN) 300 MG capsule   Other Visit Diagnoses     Phantom limb pain (HCC)    -  Primary   Relevant Medications   hydrOXYzine (ATARAX) 25 MG tablet   diazepam (VALIUM) 10 MG tablet   gabapentin (NEURONTIN) 300 MG capsule     Pt to  continue seeing [facility] for Suboxone. Advised against high doses of Gabapentin due to interaction with Suboxone. I would be willing to dose at 300mg  TID but no doses higher than that due to the potential interactions reported and advised on decreasing dose of 1 of the drugs. Pt in agreement and willing to try the 300mg . If pt wasn't on Suboxone, dose increases could be possible.   Meds ordered this encounter  Medications   gabapentin (NEURONTIN) 300 MG capsule    Sig: Take 1 capsule (300 mg total) by mouth 3 (three) times daily.    Dispense:  90 capsule    Refill:  0    No follow-ups on file.  Leeanne Rio, MD

## 2022-05-16 ENCOUNTER — Ambulatory Visit: Payer: Medicare PPO | Admitting: Family Medicine

## 2022-06-07 DEATH — deceased

## 2022-07-10 ENCOUNTER — Encounter: Payer: Self-pay | Admitting: Family Medicine

## 2022-07-17 ENCOUNTER — Encounter: Payer: Medicare PPO | Admitting: Family Medicine

## 2022-09-03 ENCOUNTER — Encounter: Payer: Self-pay | Admitting: Family Medicine
